# Patient Record
Sex: Male | Born: 1955 | Race: Black or African American | Hispanic: No | Marital: Married | Smoking: Former smoker
Health system: Southern US, Community
[De-identification: ages and names within clinical notes are randomized; demographics above are authoritative.]

## PROBLEM LIST (undated history)

## (undated) DIAGNOSIS — I10 Essential (primary) hypertension: Secondary | ICD-10-CM

## (undated) DIAGNOSIS — E7800 Pure hypercholesterolemia, unspecified: Secondary | ICD-10-CM

## (undated) DIAGNOSIS — K219 Gastro-esophageal reflux disease without esophagitis: Secondary | ICD-10-CM

## (undated) HISTORY — PX: COLONOSCOPY: SHX174

---

## 1999-12-31 ENCOUNTER — Inpatient Hospital Stay (HOSPITAL_COMMUNITY): Admission: AD | Admit: 1999-12-31 | Discharge: 2000-01-01 | Payer: Self-pay | Admitting: Cardiovascular Disease

## 2000-01-01 ENCOUNTER — Encounter: Payer: Self-pay | Admitting: Cardiovascular Disease

## 2000-11-01 ENCOUNTER — Emergency Department (HOSPITAL_COMMUNITY): Admission: EM | Admit: 2000-11-01 | Discharge: 2000-11-01 | Payer: Self-pay | Admitting: Emergency Medicine

## 2000-11-01 ENCOUNTER — Encounter: Payer: Self-pay | Admitting: Emergency Medicine

## 2002-06-08 ENCOUNTER — Emergency Department (HOSPITAL_COMMUNITY): Admission: EM | Admit: 2002-06-08 | Discharge: 2002-06-08 | Payer: Self-pay | Admitting: *Deleted

## 2004-05-10 ENCOUNTER — Emergency Department (HOSPITAL_COMMUNITY): Admission: EM | Admit: 2004-05-10 | Discharge: 2004-05-10 | Payer: Self-pay | Admitting: Emergency Medicine

## 2004-07-06 ENCOUNTER — Inpatient Hospital Stay (HOSPITAL_COMMUNITY): Admission: EM | Admit: 2004-07-06 | Discharge: 2004-07-10 | Payer: Self-pay | Admitting: Emergency Medicine

## 2004-09-19 ENCOUNTER — Ambulatory Visit (HOSPITAL_COMMUNITY): Admission: RE | Admit: 2004-09-19 | Discharge: 2004-09-19 | Payer: Self-pay | Admitting: Family Medicine

## 2005-05-26 ENCOUNTER — Ambulatory Visit (HOSPITAL_COMMUNITY): Admission: RE | Admit: 2005-05-26 | Discharge: 2005-05-26 | Payer: Self-pay | Admitting: Internal Medicine

## 2005-05-26 ENCOUNTER — Ambulatory Visit: Payer: Self-pay | Admitting: Internal Medicine

## 2005-12-28 IMAGING — CT CT CHEST W/O CM
1 of 2 series · 14 of 32 positions shown, 18 images · non-contrast
Comparison: none

HISTORY: Followup lung nodules

[Series 9126: — · axial · 0.63mm/px · z∈[+1572,+1847]mm · 14 of 66 slices shown, 18 images]
[im 6/66  mediastinal]
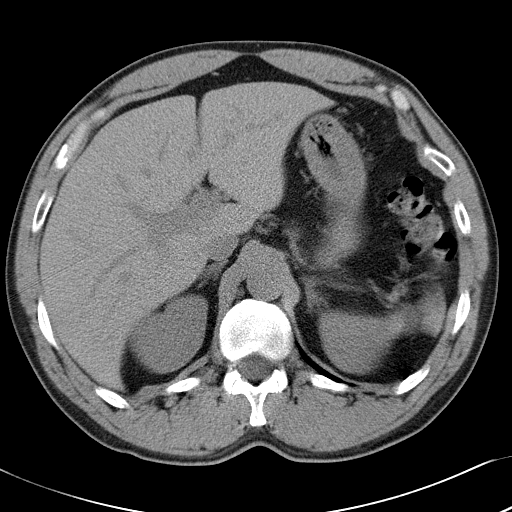
[im 6/66  lung]
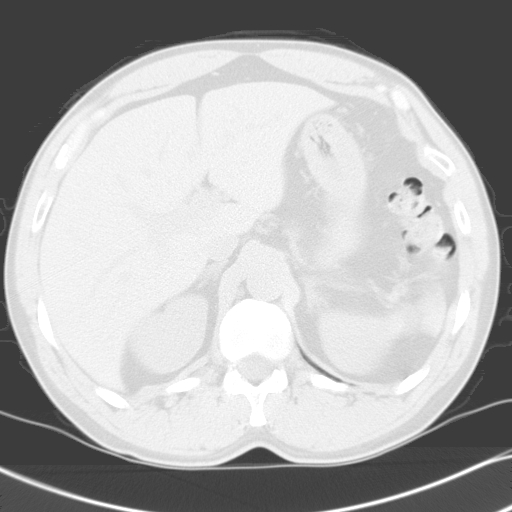
[im 11/66  lung]
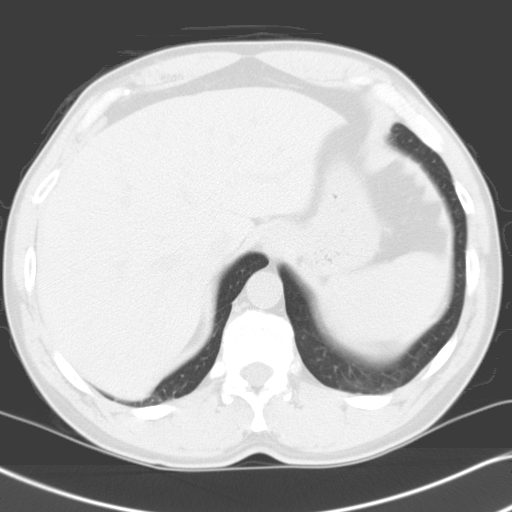
[im 16/66  lung]
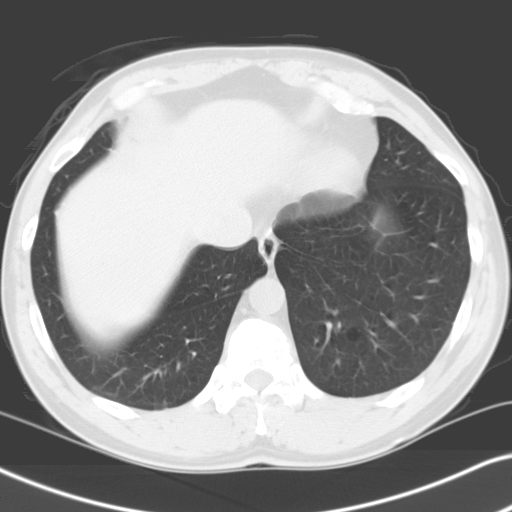
[im 21/66  lung]
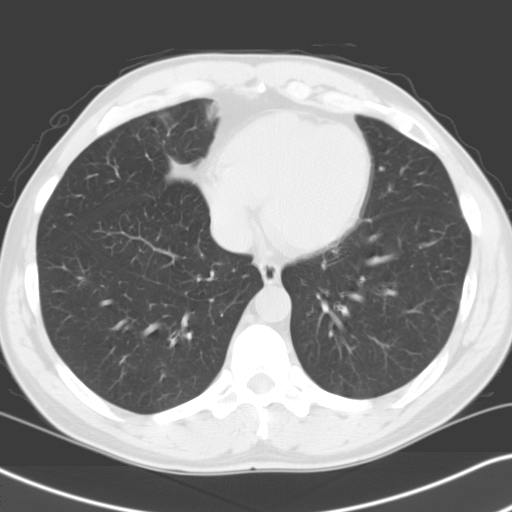
[im 26/66  mediastinal]
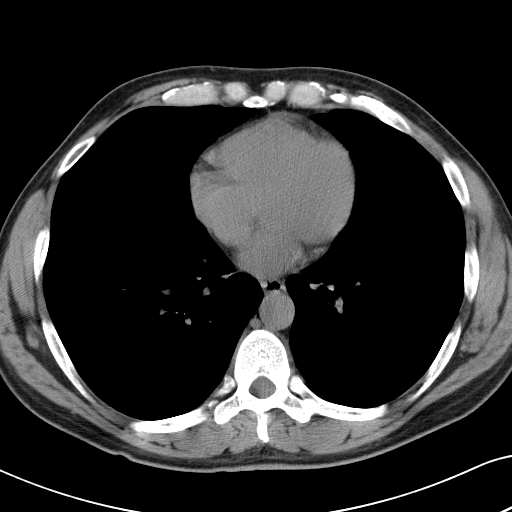
[im 26/66  lung]
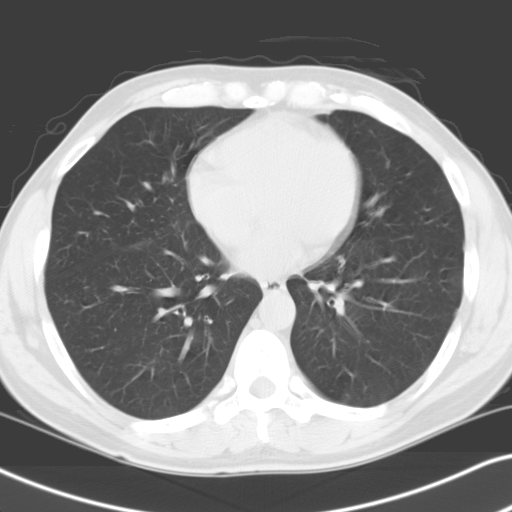
[im 31/66  lung]
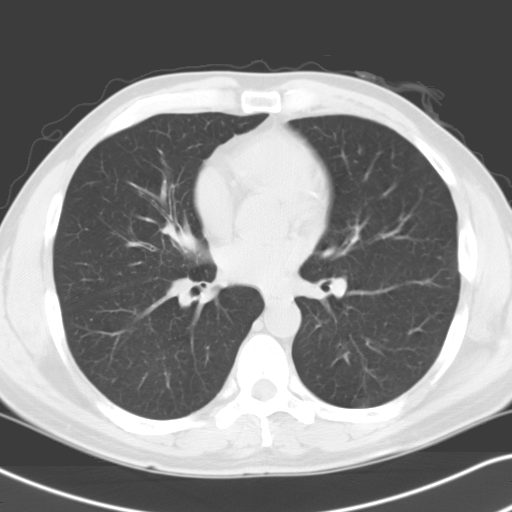
[im 32/66  lung]
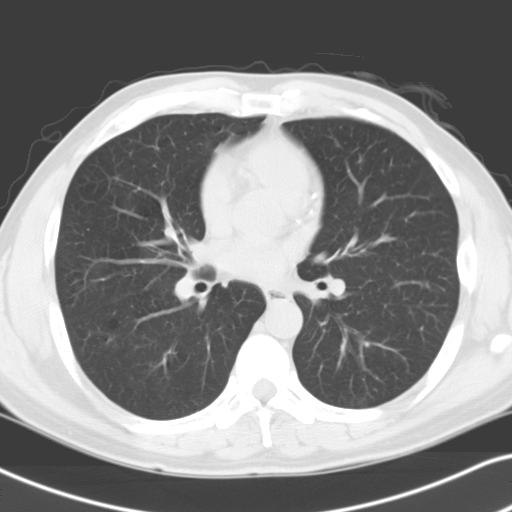
[im 33/66  lung]
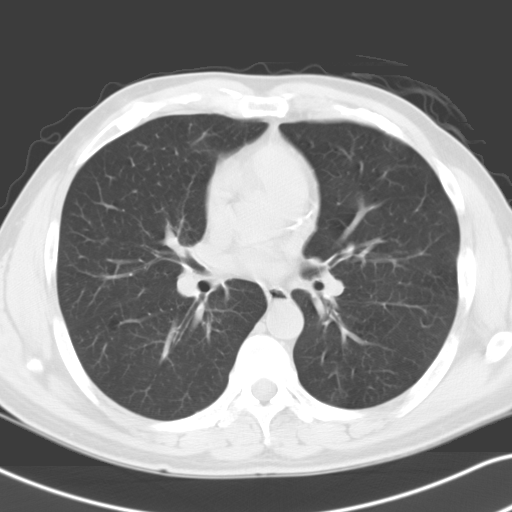
[im 36/66  mediastinal]
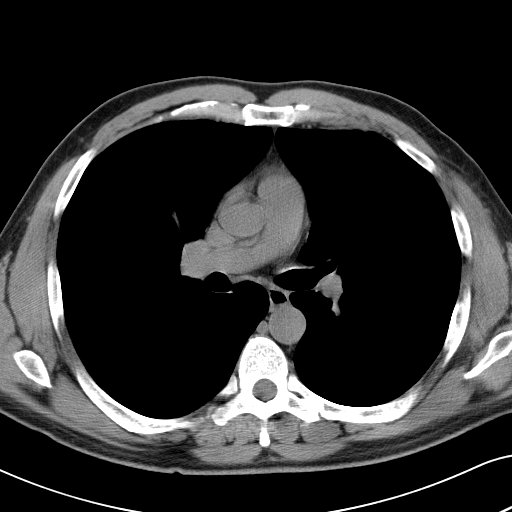
[im 36/66  lung]
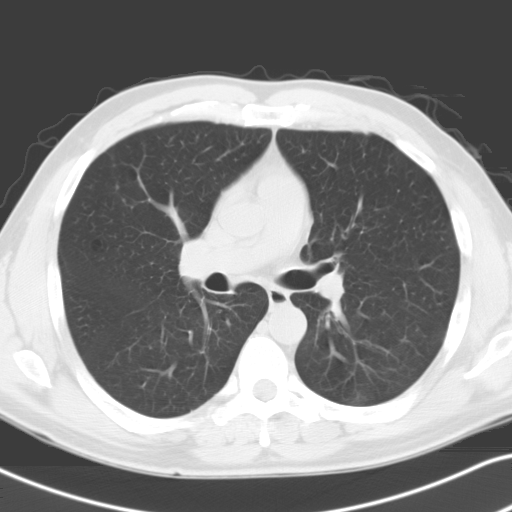
[im 41/66  lung]
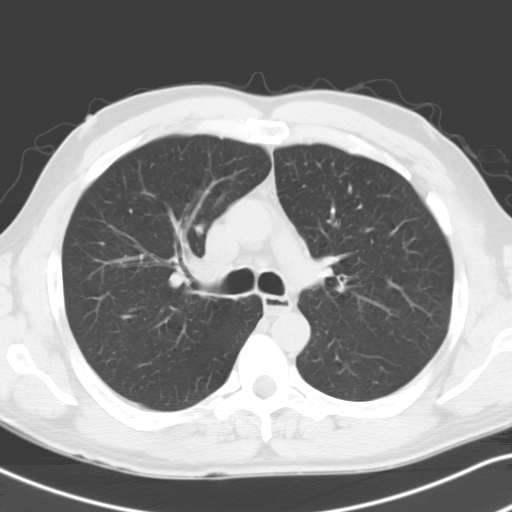
[im 46/66  lung]
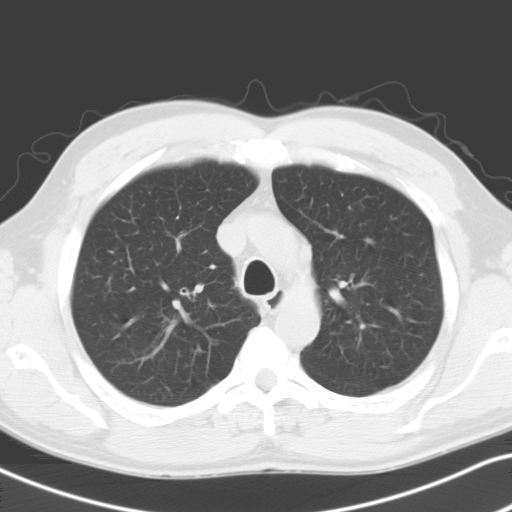
[im 51/66  lung]
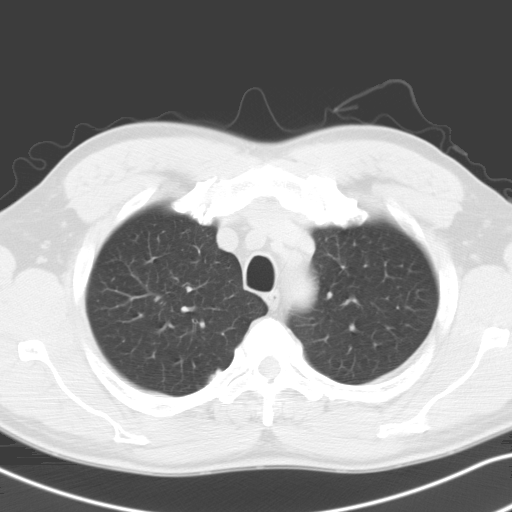
[im 56/66  mediastinal]
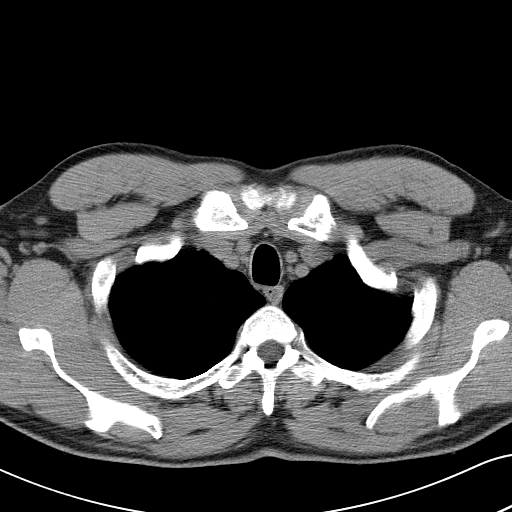
[im 56/66  lung]
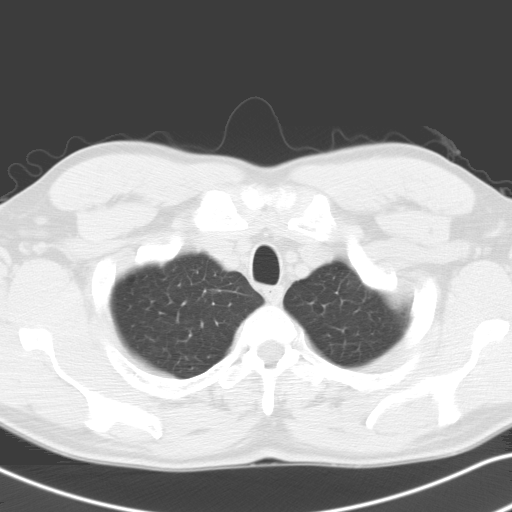
[im 61/66  lung]
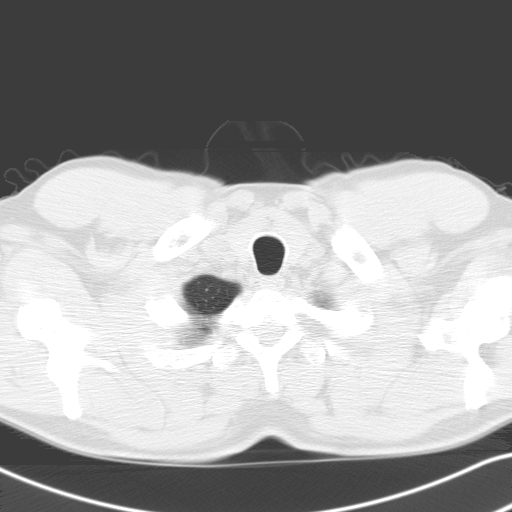

[14 of 32 positions shown; findings below may reference images not displayed]

CT CHEST WITHOUT CONTRAST:

Multidetector noncontrast helical CT imaging chest compared to 07/08/2004.

Tiny subpleural nodular densities in posteromedial aspect right upper lobe,
image 16, unchanged.
Additional tiny subpleural nodule in posterolateral aspect left lower lobe, 4 mm
diameter image 47, unchanged.
No new pulmonary mass or nodule.
No pleural effusion or pneumothorax.
Scattered normal sized mediastinal nodes.
Aorta normal caliber.
Visualized portion of upper abdomen unremarkable.
IMPRESSION: Stable appearance of tiny subpleural nodular foci in right upper lobe and left
lower lobe.
Recommend confirmation of stability for a total of 2 years to confirm benign
etiology.
Recommend next CT chest, without contrast, in 6 months.

## 2006-03-09 ENCOUNTER — Emergency Department (HOSPITAL_COMMUNITY): Admission: EM | Admit: 2006-03-09 | Discharge: 2006-03-09 | Payer: Self-pay | Admitting: Emergency Medicine

## 2006-08-16 ENCOUNTER — Ambulatory Visit (HOSPITAL_COMMUNITY): Admission: RE | Admit: 2006-08-16 | Discharge: 2006-08-16 | Payer: Self-pay | Admitting: Family Medicine

## 2006-08-31 ENCOUNTER — Emergency Department (HOSPITAL_COMMUNITY): Admission: EM | Admit: 2006-08-31 | Discharge: 2006-08-31 | Payer: Self-pay | Admitting: Emergency Medicine

## 2007-09-16 ENCOUNTER — Ambulatory Visit (HOSPITAL_COMMUNITY): Admission: RE | Admit: 2007-09-16 | Discharge: 2007-09-16 | Payer: Self-pay | Admitting: Family Medicine

## 2008-01-04 ENCOUNTER — Ambulatory Visit (HOSPITAL_COMMUNITY): Admission: RE | Admit: 2008-01-04 | Discharge: 2008-01-04 | Payer: Self-pay | Admitting: Family Medicine

## 2008-06-15 ENCOUNTER — Ambulatory Visit: Payer: Self-pay | Admitting: Gastroenterology

## 2008-06-19 ENCOUNTER — Encounter: Payer: Self-pay | Admitting: Gastroenterology

## 2008-06-19 ENCOUNTER — Ambulatory Visit (HOSPITAL_COMMUNITY): Admission: RE | Admit: 2008-06-19 | Discharge: 2008-06-19 | Payer: Self-pay | Admitting: Gastroenterology

## 2008-06-19 ENCOUNTER — Ambulatory Visit: Payer: Self-pay | Admitting: Gastroenterology

## 2008-07-13 ENCOUNTER — Encounter: Payer: Self-pay | Admitting: Gastroenterology

## 2008-07-23 DIAGNOSIS — K3189 Other diseases of stomach and duodenum: Secondary | ICD-10-CM | POA: Insufficient documentation

## 2008-07-23 DIAGNOSIS — R1013 Epigastric pain: Secondary | ICD-10-CM

## 2008-07-23 DIAGNOSIS — R197 Diarrhea, unspecified: Secondary | ICD-10-CM | POA: Insufficient documentation

## 2008-07-23 DIAGNOSIS — R109 Unspecified abdominal pain: Secondary | ICD-10-CM | POA: Insufficient documentation

## 2008-12-24 ENCOUNTER — Ambulatory Visit (HOSPITAL_COMMUNITY): Admission: RE | Admit: 2008-12-24 | Discharge: 2008-12-24 | Payer: Self-pay | Admitting: Family Medicine

## 2010-05-31 ENCOUNTER — Encounter: Payer: Self-pay | Admitting: Family Medicine

## 2010-06-02 ENCOUNTER — Encounter: Payer: Self-pay | Admitting: Family Medicine

## 2010-09-06 ENCOUNTER — Emergency Department (HOSPITAL_COMMUNITY)
Admission: EM | Admit: 2010-09-06 | Discharge: 2010-09-06 | Disposition: A | Discharge status: Home / Self Care | Payer: BC Managed Care – PPO | Attending: Emergency Medicine | Admitting: Emergency Medicine

## 2010-09-06 DIAGNOSIS — J449 Chronic obstructive pulmonary disease, unspecified: Secondary | ICD-10-CM | POA: Insufficient documentation

## 2010-09-06 DIAGNOSIS — J4489 Other specified chronic obstructive pulmonary disease: Secondary | ICD-10-CM | POA: Insufficient documentation

## 2010-09-23 NOTE — Op Note (Signed)
NAME:  Dalton Gregory, Dalton Gregory NO.:  [id number]   MEDICAL RECORD NO.:  [id number]          PATIENT TYPE:  AMB   LOCATION:  DAY                           FACILITY:  [facility]   PHYSICIAN:  Kassie Mends, M.D.      DATE OF BIRTH:  February 06, 1956   DATE OF PROCEDURE:  06/19/2008  DATE OF DISCHARGE:                               OPERATIVE REPORT   REFERRING PHYSICIAN:  Corrie Mckusick, MD   PROCEDURE:  Esophagogastroduodenoscopy with cold forceps biopsy.   INDICATION FOR EXAM:  Mr. Spease is a 55 year old male who presents  with new-onset dyspepsia.  He is using BC powders and drinks alcohol on  a regular basis.   FINDINGS:  1. Normal esophagus without evidence of Barrett, mass, erosion,      ulceration, or stricture.  2. Diffuse erythema in the body and the antrum without erosion or      ulceration.  Biopsies obtained via cold forceps to evaluate for H.      pylori gastritis.  3. Patchy erythema in the duodenal bulb and extending into the second      portion of the duodenum.   DIAGNOSES:  Mild-to-moderate gastritis and duodenitis.   RECOMMENDATIONS:  1. Screening colonoscopy in 2017.  2. He should avoid BC powders and reduce his alcohol use.  3. No aspirin or NSAIDs for 30 days.  4. He may continue Nexium or Zegerid once daily.  5. We will call him with results of his biopsies.  6. He should follow a high-fiber diet.  He was given a handout on high-      fiber diet and gastritis.  7. He already has a follow up appointment to see me in 6 weeks.   MEDICATIONS:  1. Demerol 100 mg IV.  2. Versed 5 mg IV.   PROCEDURE TECHNIQUE:  Physical exam was performed.  Informed consent was  obtained from the patient after explaining the benefits, risks, and  alternatives to the procedure.  The patient was connected to the monitor  and placed in the left lateral position.  Continuous oxygen was provided  by nasal cannula, and IV medicine was administered through an indwelling  cannula.   After administration of sedation, the patient's esophagus was  intubated and the scope was advanced under direct visualization to the  second portion of the duodenum.  The scope was  removed slowly by carefully examining the color, texture, anatomy, and  integrity of the mucosa on the way out. The patient was recovered in  endoscopy and discharged home in satisfactory condition.   PATH:  Moderate gastritis 2o to EtOH and BC powders.      Kassie Mends, M.D.  Electronically Signed     SM/MEDQ  D:  06/19/2008  T:  06/19/2008  Job:  [id number]   cc:   Corrie Mckusick, M.D.  Fax: [phone]

## 2010-09-23 NOTE — Consult Note (Signed)
NAME:  Dalton Gregory, FODOR NO.:  [id number]   MEDICAL RECORD NO.:  [id number]          PATIENT TYPE:  AMB   LOCATION:  DAY                           FACILITY:  [facility]   PHYSICIAN:  Kassie Mends, M.D.      DATE OF BIRTH:  [birth year]   DATE OF CONSULTATION:  DATE OF DISCHARGE:                                 CONSULTATION   REFERRING PHYSICIAN:  Corrie Mckusick, MD   REASON FOR CONSULTATION:  Abdominal discomfort.   HISTORY OF PRESENT ILLNESS:  Dalton Gregory is a 55 year old male who  complains of problems with the stomach for the last 6-8 months.  He  consumes 1 pint of Patron with Sprite per week.  He also takes Aleve for  headaches.  He was taking omeprazole for his symptoms, which made them  improve but caused him to have dizziness and headaches.  He describes  the discomfort as a gassy feeling in his left upper quadrant and right  upper quadrant and down the left side of his abdomen.  He has had  heartburn and indigestion off and on for 3-4 years.  He was never really  on any regular medicine and took Tums as needed.  He does not have any  abdominal pain with food or with his intermittent diarrhea.  The only  thing that made his symptoms better was the omeprazole.  He denies any  problems of swallowing.  He does have intermittent watery stool with  constipation and normal stool.  He denies any abdominal pain.  He  usually has two bowel movements a day without straining.  He denies any  blood in his stool or black stool.  He was using BC powders when he had  headaches.  He is not using Goody powder, ibuprofen, or Motrin.  When he  had a bad cold he went from 176 pounds to 171 pounds.  His appetite is  good.  He did have this problem with this bubbling sensation in the  stomach 10 years ago.   PAST MEDICAL HISTORY:  Asthma and uses albuterol once a month.   PAST SURGICAL HISTORY:  None.   ALLERGIES:  OMEPRAZOLE.   MEDICATIONS:  Albuterol as needed.   FAMILY  HISTORY:  His mother died of breast cancer last year.  He denies  any family history of colon cancer or colon polyps.   SOCIAL HISTORY:  He is engaged.  He works for [employer]  He does not smoke.   REVIEW OF SYSTEMS:  Per the HPI, otherwise all systems are negative.   PHYSICAL EXAMINATION:  VITAL SIGNS:  Weight 171 pounds, height 5 feet 9  inches, BMI 25.3 (slightly overweight), temperature 98.2, blood pressure  120/78, and pulse 72.  GENERAL:  He is in no apparent distress, alert, and oriented x4.  HEENT:  Atraumatic and normocephalic.  Pupils equal, reactive to light.  Mouth, no oral lesions.  Posterior pharynx without erythema or exudate.  NECK:  Full range of motion.  No lymphadenopathy.  LUNGS:  Clear to auscultation bilaterally.  CARDIOVASCULAR:  Regular  rhythm.  No murmur.  Normal S1 and S2.  ABDOMEN:  Bowel sounds are present, soft, nontender, and nondistended.  No rebound or guarding.  No hepatosplenomegaly or abdominal bruits.  EXTREMITIES:  No cyanosis or edema.  NEUROLOGIC:  He has no focal neurologic deficits.   LABORATORY DATA:  In May 2009, white count 7.1, hemoglobin 13.4,  platelets 331, BUN 15, creatinine 0.95, total bilirubin 0.5, alk phos  50, AST 26, ALT 32, albumin 4.4, and PSA normal.   RADIOGRAPHIC STUDIES:  Abdominal ultrasound in August 2009 revealed  normal gallbladder without stones, common bile duct 2.9 mm, liver and  pancreas normal in appearance.   ENDOSCOPY:  Colonoscopy in January 2007 revealed right-sided  diverticula.   ASSESSMENT:  Dalton Gregory is a 55 year old male who has got new onset  dyspepsia.  The differential diagnosis include uncontrolled  gastroesophageal reflux disease, Helicobacter pylori gastritis, NSAID  gastritis, or insidious peptic ulcer disease, and low likelihood of  esophageal or gastric malignancy. Thank you for allowing me to see Mr.  Kroft in consultation.  My recommendations follow.    RECOMMENDATIONS:  1. Management of his bowel movement.  He is given discharge      instructions in writing.  He is to drink 6-8 cups of water daily.      He is to try high-fiber diet.  He is given a handout on high-fiber      diet.  He is asked to use Fibersure once a day.  He is also asked      to reduce his uses alcohol consumption.  Alcohol can cause      diarrhea.  2. He will be scheduled for an EGD on next Tuesday.  3. He has a return patient visit in 6 weeks.      Kassie Mends, M.D.  Electronically Signed     SM/MEDQ  D:  06/15/2008  T:  06/16/2008  Job:  [id number]   cc:   Corrie Mckusick, M.D.  Fax: [phone]

## 2010-09-26 NOTE — Discharge Summary (Signed)
[place]. [facility]  Patient:    Dalton Gregory                      MRN: [id number] Adm. Date:  [id number] Disc. Date: [id number] Attending:  Virgina Evener Dictator:   [facility].                           Discharge Summary  ADMISSION DIAGNOSES: 1. Chest pain of questionable etiology. 2. History of tobacco use.  DISCHARGE DIAGNOSES: 1. Chest pain of questionable etiology. 2. History of tobacco use. 3. Status post cardiac catheterization, January 01, 2000, with normal coronary    arteries and normal left ventricular function.  HISTORY OF PRESENT ILLNESS:  The patient is a 55 year old African-American male who presented to [facility] on December 30, 1999, with complaints of chest pain.  He had no prior cardiac history.  He does have cardiac risk factors positive for smoking one-pack-a-day for 10 years.  He stated that he had the onset of left chest pain at approximately 8 a.m. while driving.  He said it was a 7/10 chest pain.  It was sharp at the beginning and then become a dull pain with radiation to the left neck and left arm numbness.  It last for one hour.  It was relieved with sublingual nitroglycerin in the [facility] ER.  At the time of interview here at [facility], he had residual chest pressure of approximately 3/10.  There were no associated symptoms at the time.  No palpitations, particularly syncope and no syncope.  PHYSICAL EXAMINATION:  On exam at that time his blood pressure was 120/77, heart rate 63.  EKG revealed normal sinus rhythm with no ST or T-wave abnormality.  Cardiac enzymes were negative.  At that time, he was seen by Dr. Jenne Campus.  He was planned for admission to telemetry.  We would check serial enzymes to rule out MI.  He was placed on IV heparin and IV nitroglycerin, aspirin and Plavix, and planned for cardiac catheterization the following day.  HOSPITAL COURSE:  On January 01, 2000, the patient  underwent cardiac catheterization by Dr. Nicki Guadalajara.  He was found to have normal coronary arteries and normal LV function.  He was planned for discharge home after bed rest complete later that day.  At 4:30 p.m. on January 01, 2000, the patient remained stable.  He was hemodynamically stable, and his groin was stable without bleed or hematoma. He was then planned for discharge home.  HOSPITAL CONSULTS:  None.  HOSPITAL PROCEDURES:  Cardiac catheterization on January 01, 2000, by Dr. Nicki Guadalajara revealing normal coronary arteries and normal LV function.  EKG on admission revealed normal sinus rhythm with no ST or T-wave changes.  Laboratories revealed TSH normal at 2.701 with a profile with cholesterol of 162, triglycerides 72, HDL 79, LDL 69.  Cardiac enzymes were negative with CK of 196 and 160, CKMB 2.1 and 2.1, troponin I of less than 0.03 x 2.  Metabolic profile showed sodium of 139, potassium 3.6, BUN 15, creatinine 1.9.  CBC revealed WBC of 9.9, hemoglobin 13.1, hematocrit 38.3, platelets 274.  DISCHARGE MEDICATIONS:  None.  ACTIVITY:  No strenuous activity, lifting greater than five pounds, driving, or sexual activity for three days.  DIET:  Low salt and low fat diet.  WOUND CARE:  To gently wash the wound with warm water and soap.  FOLLOWUP:  Call our office at [phone] if any bleeding, or increased size or pain at the groin.  Followup with Dr. Tresa Endo in [place] on September 11 at 3:10 p.m. DD:  01/08/00 TD:  01/09/00 Job: [id number]

## 2010-09-26 NOTE — Discharge Summary (Signed)
NAME:  Dalton Gregory, Dalton Gregory NO.:  [id number]   MEDICAL RECORD NO.:  [id number]          PATIENT TYPE:  INP   LOCATION:  A305                          FACILITY:  [facility]   PHYSICIAN:  Corrie Mckusick, M.D.  DATE OF BIRTH:  04-02-1956   DATE OF ADMISSION:  07/06/2004  DATE OF DISCHARGE:  03/02/2006LH                                 DISCHARGE SUMMARY   DISCHARGE DIAGNOSES:  1.  Chronic obstructive pulmonary disease exacerbation.  2.  Bronchitis.   HISTORY OF PRESENT ILLNESS:   PAST MEDICAL HISTORY:  Please see admission H&P.   HOSPITAL COURSE:  A 55 year old gentleman with a longstanding smoking  history who presents for two-three days with aggressive problems with  coughing and productive sputum.  He has COPD with heavy smoking use and was  admitted with the diagnosis of bronchitis.  He was admitted for aggressive  nebulizer treatments with Xopenex and __________ nebulizers.  He also had  double coverage with Rocephin and azithromycin.  Prednisone was begun as  well.   He improved slowly over the proceeding days.  I went ahead and did a CAT  scan of the chest to get a better look at the lungs.  The scan showed tiny  areas of subpleural nodularity of the right upper lobe and left upper lobe.  I wanted these to be followed up in three months.  Due to this, I decided to  have Dr. Juanetta Gosling take a look from a pulmonary standpoint.  Please see his  consult for details.  He had nothing more to add and I agree with the  followup CT.   The began to improve on a daily basis and was ready for discharge on July 10, 2004.  T-max of 98.8, blood pressure was in the 120s-130s/70s, heart rate  in the 80s-90s, respiratory rate in the 20s, O2 saturation 96% on room air.  When I saw Dammon he was pleasant, talkative, and feeling much better.  HEENT,  the nasopharynx was clear with moist mucous membranes.  Neck was supple with  no lymphadenopathy.  Chest was with much better breath sounds.   No wheezes,  no rhonchi, no rales.  Cardiovascular was a regular rate and rhythm.  Normal  S1 and S2.  No murmurs.  Abdomen soft and nontender.  Extremities no edema.   DISCHARGE MEDICATIONS:  Include:  1.  Augmentin 875 b.i.d. for an additional seven days.  2.  Combivent 2 puffs q.i.d.  3.  Anaplex syrup 1 tsp. q.4-6h. p.r.n.   DISCHARGE INSTRUCTIONS:  I strongly encouraged him to quit smoking.   FOLLOW UP:  He is going to follow up in 1-1/2 weeks with myself in the  office and we will again repeat the CT scan in approximately three months.   DISCHARGE CONDITION:  Improved and stable.      JCG/MEDQ  D:  07/10/2004  T:  07/10/2004  Job:  [id number]

## 2010-09-26 NOTE — Op Note (Signed)
NAME:  Dalton Gregory, Dalton Gregory NO.:  [id number]   MEDICAL RECORD NO.:  [id number]          PATIENT TYPE:  AMB   LOCATION:  DAY                           FACILITY:  [facility]   PHYSICIAN:  Lionel December, M.D.    DATE OF BIRTH:  [birth year]   DATE OF PROCEDURE:  05/26/2005  DATE OF DISCHARGE:                                 OPERATIVE REPORT   PROCEDURE:  Colonoscopy.   INDICATION:  [name] is a 56 year old Afro-American male who is here for  screening colonoscopy. Family history is negative for colorectal carcinoma.  He states he has occasional diarrhea when he gets irritation to his  hemorrhoids but does not have any hematochezia. Procedure and risks were  reviewed with the patient, and informed consent was obtained.   MEDICINES CONSCIOUS SEDATION:  Demerol 50 mg IV, Versed 5 mg IV.   FINDINGS:  Procedure performed in endoscopy suite. The patient's vital signs  and O2 saturation were monitored during the procedure and remained stable.  The patient was placed in left lateral position and rectal examination  performed. No abnormality noted on external or digital exam. Olympus  videoscope was placed in rectum and advanced under vision into sigmoid colon  and beyond. Preparation was excellent. Scope was passed into cecum which was  identified by appendiceal orifice and ileocecal valve. Pictures taken for  the record. As the scope was withdrawn, colonic mucosa was carefully  examined. There a few diverticula involving the ascending colon but none  distally. Rectal mucosa similarly was normal. Scope was retroflexed to  examine anorectal junction, and he had two small anal papilla. Endoscope was  straightened and withdrawn. The patient tolerated the procedure well.   FINAL DIAGNOSIS:  1.  Few small diverticula involving the ascending colon (right colon).  2.  Two anal papilla.   RECOMMENDATIONS:  1.  High-fiber diet plus fiber supplement 3 to 4 g per day.  2.  He should continue  yearly Hemoccults and consider next screening in 10      years from now.      Lionel December, M.D.  Electronically Signed    NR/MEDQ  D:  05/26/2005  T:  05/26/2005  Job:  [id number]   cc:   Corrie Mckusick, M.D.  Fax: [phone]

## 2010-09-26 NOTE — Consult Note (Signed)
NAME:  YVAN, DORITY NO.:  [id number]   MEDICAL RECORD NO.:  [id number]          PATIENT TYPE:  INP   LOCATION:  A305                          FACILITY:  [facility]   PHYSICIAN:  Edward L. Juanetta Gosling, M.D.DATE OF BIRTH:  1955-11-07   DATE OF CONSULTATION:  DATE OF DISCHARGE:                                   CONSULTATION   REASON FOR CONSULTATION:  Abnormal CT scan.  COPD.   HISTORY:  Mr. Pankratz is a 55 year old African American male who has had a  long smoking history and diagnosis of COPD.  He came to the emergency room  because of shortness of breath and productive cough.  He was wheezing, and  in the emergency room he had a pO2 of 62, pCO2 36, pH 7.37 on room air.  He  had been given treatments in the emergency room but did not clear very much  and was admitted.   PAST MEDICAL HISTORY:  Other than his COPD, essentially negative.  He has  had no surgery.   MEDICATIONS:  He is on no medications, although he says he wants an inhaler.   SOCIAL HISTORY:  He has about an 80 to 90-pack-year smoking history.  He  drinks alcohol on weekends.   REVIEW OF SYSTEMS:  Otherwise is essentially negative.   PHYSICAL EXAMINATION:  VITAL SIGNS:  Temperature 98.4 degrees, pulse 97,  respirations 20.  His O2 saturation is in the 90s.  GENERAL:  He is alert, oriented, and does not appear to be in any acute  distress now.  HEENT:  His mucous membranes are moist.  His nose and throat are clear.  CHEST:  Wheezes bilaterally.  HEART:  Regular, without gallop.  ABDOMEN:  Soft, without masses.  EXTREMITIES:  No edema.  NEUROLOGIC:  CNs grossly intact.   LABORATORY DATA:  His chest x-ray showed COPD but no pneumonia.  His  potassium was 3.2 on admission.  His CBC showed a white count of 14,000.  He  did have some metamyelocytes.   ASSESSMENT:  He has chronic obstructive pulmonary disease with exacerbation,  possibly a pneumonia.  I have reviewed his CT scan, and I agree with the  interpretation by the radiologist.  The findings on the CT are too  nondescript to be certain what we are dealing with.  I do think he will need  to have a repeat CT.  I am somewhat concerned about the fact that he had  metamyelocytes seen on his admission CBC, so I think we need to go ahead  and repeat that and just make sure he is okay.  His potassium should be  rechecked to make sure that is okay because of the use of the beta agonist  and the effect on causing hypokalemia.  Otherwise, I certainly agree with  treatments and would not pursue the CT further except to go ahead with a  repeat CT in approximately 3 months.      ELH/MEDQ  D:  07/09/2004  T:  07/09/2004  Job:  [id number]

## 2010-09-26 NOTE — Cardiovascular Report (Signed)
[place]. [facility]  Patient:    Dalton Gregory, Dalton Gregory                      MRN: [id number] Proc. Date: 01/01/00 Adm. 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  He presented to [facility] Emergency Room yesterday with significant 1 hour of substernal chest pressure without definitive ECG changes.  Cardiac risk factors are notable for tobacco use, as well as, elevated lipids.  He was treated with IV heparin, nitroglycerin with relief. He did have some residual chest pain upon presenting to [facility].  He was referred for definitive diagnostic cardiac catheterization.  HEMODYNAMIC DATA:  Central aortic pressure 117/70, left ventricular pressure 117/21.  ANGIOGRAPHIC DATA:  Left main coronary artery was a long, normal vessel that bifurcated into a LAD and left circumflex system.  The LAD was angiographically normal and gave rise to several diagonal vessels and a small septal perforating arteries and extended and wrapped around the LV apex.  The circumflex vessel was angiographically normal and gave rise to a small proximal marginal vessel and a large marginal vessel, which extended to the inferolateral apex.  The right coronary artery was a large caliber, normal vessel that gave rise to a small PDA and posterolateral system.  Biplane angiography revealed normal global LV function without focal segmental wall motion abnormalities.  IMPRESSION: 1. Normal LV function. 2. Normal coronary arteries. DD:  01/01/00 TD:  01/01/00 Job: [id number]

## 2010-09-26 NOTE — H&P (Signed)
NAME:  Dalton Gregory, Dalton Gregory NO.:  [id number]   MEDICAL RECORD NO.:  [id number]          PATIENT TYPE:  INP   LOCATION:  A305                          FACILITY:  [facility]   PHYSICIAN:  Corrie Mckusick, M.D.  DATE OF BIRTH:  Feb 20, 1956   DATE OF ADMISSION:  07/06/2004  DATE OF DISCHARGE:  LH                                HISTORY & PHYSICAL   ADMISSION DIAGNOSES:  1.  Chronic obstructive pulmonary disease exacerbation.  2.  Bronchitis.   HISTORY OF PRESENT ILLNESS:  This is a 55 year old gentleman with long-  standing history of smoking who presents with two to three days of  progressive cough and productive sputum.  No frank shortness of breath, but  quite a bit of wheeze.  He was seen in the emergency department with an ABG  of 7.37, CO2 36.8, PO2 of 62.8, bicarbonate of 20.8, on room air.  This was  not an acute shortness of breath, it had been progressive.  He is a heavy  smoker, and on examination in the emergency department was given nebulizer  treatments with some slight improvement.   He reports no cardiovascular symptomatology such as chest pain or associated  PND or orthopnea.  He has really no other comorbidities other than heavy  cigarette use.   PAST MEDICAL HISTORY:  None.   PAST SURGICAL HISTORY:  None.   ADMISSION MEDICATIONS:  None.   SOCIAL HISTORY:  Smoked a pack a day for many years.  Does drink about a  pint of alcohol a week, but not on a daily basis per patient.  Positive for  hypertension and diabetes, but really no other significant past medical  history.  He is in [employer].   PHYSICAL EXAMINATION:  VITAL SIGNS:  Temperature 98.4, pulse 97,  respirations 20, blood pressure 113/70, O2 saturation 96% on 2 L.  GENERAL:  When I saw him, he was a pleasant, talkative gentleman, somewhat  joking, but quite a bit of cough and congestion.  HEENT:  Nasopharynx clear with moist mucous membranes.  NECK:  There is some erythema on the posterior  wall of the neck.  Bilateral  shotty lymphadenopathy.  CHEST:  Diffuse end expiratory wheezes with upper rhonchi heard throughout.  There is no real areas of consolidation.  CARDIOVASCULAR:  Regular rate and rhythm, normal S1 and S2, no S3, S4,  murmurs, gallops, or rubs.  ABDOMEN:  Soft, nontender, nondistended.  EXTREMITIES:  No edema.   LABORATORY DATA:  Chest x-ray on admission showed COPD with no evidence of  active chest disease.   Labs on admission:  Please see flow sheet for details.   ASSESSMENT AND PLAN:  A 55 year old gentleman with chronic obstructive  pulmonary disease and heavy smoking use who presents with bronchitis.   PLAN:  1.  Admit for aggressive nebulizer treatments with Xopenex and Atrovent      nebulizers q.i.d.  2.  Albuterol nebulizers q.2h. p.r.n.  3.  Double coverage with Rocephin 1 g intravenously q.24h., as well as      azithromycin 500 mg intravenously q.24h.  4.  Strongly encouraged him to quit smoking.  5.  We will cover with prednisone 50 mg daily.  6.  If the chest congestion continues, we will go ahead and order a chest CT      scan.  7.  We will keep O2 saturations greater than 90% on 2 L via nasal cannula.      JCG/MEDQ  D:  07/08/2004  T:  07/08/2004  Job:  [id number]

## 2011-07-17 ENCOUNTER — Encounter (HOSPITAL_COMMUNITY): Payer: Self-pay | Admitting: *Deleted

## 2011-07-17 ENCOUNTER — Emergency Department (HOSPITAL_COMMUNITY)
Admission: EM | Admit: 2011-07-17 | Discharge: 2011-07-17 | Disposition: A | Discharge status: Home / Self Care | Payer: BC Managed Care – PPO | Attending: Emergency Medicine | Admitting: Emergency Medicine

## 2011-07-17 DIAGNOSIS — R040 Epistaxis: Secondary | ICD-10-CM

## 2011-07-17 DIAGNOSIS — K219 Gastro-esophageal reflux disease without esophagitis: Secondary | ICD-10-CM | POA: Insufficient documentation

## 2011-07-17 DIAGNOSIS — J45909 Unspecified asthma, uncomplicated: Secondary | ICD-10-CM | POA: Insufficient documentation

## 2011-07-17 HISTORY — DX: Gastro-esophageal reflux disease without esophagitis: K21.9

## 2011-07-17 MED ORDER — OXYMETAZOLINE HCL 0.05 % NA SOLN
NASAL | Status: AC
  Administered 2011-07-17: 06:00:00
  Filled 2011-07-17: qty 15

## 2011-07-17 MED ORDER — SILVER NITRATE-POT NITRATE 75-25 % EX MISC
CUTANEOUS | Status: AC
  Administered 2011-07-17: 06:00:00
  Filled 2011-07-17: qty 1

## 2011-07-17 MED ORDER — LIDOCAINE-EPINEPHRINE (PF) 1 %-1:200000 IJ SOLN
INTRAMUSCULAR | Status: AC
  Administered 2011-07-17: 06:00:00
  Filled 2011-07-17: qty 10

## 2011-07-17 NOTE — ED Provider Notes (Addendum)
History     CSN: [id number]  Arrival date & time 07/17/11  0457   First MD Initiated Contact with Patient 07/17/11 [phone]      Chief Complaint  Patient presents with  . Epistaxis    (Consider location/radiation/quality/duration/timing/severity/associated sxs/prior treatment) HPI Comments: 56 year old male with history of asthma and acid reflux presents with a complaint of nosebleed. This occurred acutely overnight, has been intermittent, improved with holding pressure on the anterior nose but has been persistent and mild. He denies being on any anticoagulant therapy, no history of coagulopathy, no trauma, no sneezing coughing or taking it. Symptoms are persistent, mild, better with applying pressure to the anterior nose.  He does admit to having a cough or cold-like symptoms in the last 2 weeks.  Patient is a 56 y.o. male presenting with nosebleeds. The history is provided by the patient.  Epistaxis     Past Medical History  Diagnosis Date  . Asthma   . Acid reflux     History reviewed. No pertinent past surgical history.  No family history on file.  History  Substance Use Topics  . Smoking status: Never Smoker   . Smokeless tobacco: Not on file  . Alcohol Use: Yes      Review of Systems  HENT: Positive for nosebleeds.   Gastrointestinal: Negative for nausea and vomiting.  Hematological: Does not bruise/bleed easily.    Allergies  Nexium  Home Medications   Current Outpatient Rx  Name Route Sig Dispense Refill  . ALBUTEROL IN Inhalation Inhale into the lungs.      BP 157/91  Pulse 68  Temp(Src) 98 F (36.7 C) (Oral)  Resp 18  SpO2 99%  Physical Exam  Nursing note and vitals reviewed. Constitutional: He appears well-developed and well-nourished. No distress.  HENT:  Head: Normocephalic and atraumatic.       Right anterior nostril with focus of bleeding. No blood in the oropharynx  Eyes: Conjunctivae are normal. Right eye exhibits no discharge. Left  eye exhibits no discharge. No scleral icterus.  Neck: Normal range of motion. Neck supple.  Cardiovascular: Normal rate and regular rhythm.   No murmur heard. Pulmonary/Chest: Effort normal and breath sounds normal.  Abdominal: Soft. There is no tenderness.  Musculoskeletal: He exhibits no edema and no tenderness.  Lymphadenopathy:    He has no cervical adenopathy.  Skin: Skin is warm and dry. He is not diaphoretic.    ED Course  EPISTAXIS MANAGEMENT Date/Time: 07/17/2011 5:30 AM Performed by: Eber Hong D Authorized by: Eber Hong D Consent: Verbal consent obtained. Written consent not obtained. Risks and benefits: risks, benefits and alternatives were discussed Consent given by: patient Patient understanding: patient states understanding of the procedure being performed Patient identity confirmed: verbally with patient Time out: Immediately prior to procedure a "time out" was called to verify the correct patient, procedure, equipment, support staff and site/side marked as required. Local anesthetic: topical anesthetic Patient sedated: no Treatment site: right anterior Repair method: anterior pack Post-procedure assessment: bleeding stopped Treatment complexity: simple Patient tolerance: Patient tolerated the procedure well with no immediate complications. Comments: Packing was removed from the nose prior to discharge after cessation of bleeding   (including critical care time)  Labs Reviewed - No data to display No results found.   1. Epistaxis       MDM  Acute onset right anterior nosebleed, cotton ball packing with lidocaine with epinephrine placed initially for packing.  I personally passed the nose with cotton balls, soaked  in lidocaine with epinephrine, after approximately 20 minutes pickup balls were removed and bleeding had completely subsided. The patient had no discomfort with the procedure, was able to use Afrin after the procedure and will be discharged  home in an improved condition to followup with ear nose and throat as needed.        Vida Roller, MD 07/17/11 1308  Vida Roller, MD 07/17/11 [phone]

## 2011-07-17 NOTE — Discharge Instructions (Signed)

## 2011-07-17 NOTE — ED Notes (Signed)
Pt reports he woke up this am to go to the bathroom and his nose began to bleed, he reports he was able to stop the bleeding 2 different times and now unable get bleeding under control, pt has tissue to nose, bleeding is from rt nostril, bleeding controlled at this time

## 2011-12-01 NOTE — H&P (Signed)
  NTS SOAP Note  Vital Signs:  Vitals as of: 12/01/2011: Systolic 130: Diastolic 87: Heart Rate 70: Temp 98.65F: Height 16ft 9in: Weight 174Lbs 0 Ounces: BMI 26  BMI : 25.7 kg/m2  Subjective: This 56 Years 43 Months old Male presents for screening TCS.  Last had a TCS over ten years ago.  Denies any GI complaints.  No family h/o colon cancer.   Review of Symptoms:  Constitutional:unremarkable   Head:unremarkable    Eyes:unremarkable   Nose/Mouth/Throat:unremarkable Cardiovascular:  unremarkable   Respiratory:unremarkable   Gastrointestinal:  unremarkable   Genitourinary:unremarkable     Musculoskeletal:unremarkable   Skin:unremarkable Hematolgic/Lymphatic:unremarkable     Allergic/Immunologic:unremarkable     Past Medical History:    Reviewed   Past Medical History  Surgical History: none Medical Problems: copd, HTN Allergies: nkda Medications: proventil, norvasc   Social History:Reviewed  Social History  Preferred Language: English ([place]) Ethnicity: Not Hispanic / Latino Age: 56 Years 58 Months Marital Status:  S Alcohol:  Yes Recreational drug(s):  No   Smoking Status: Former smoker reviewed on 12/01/2011 Started Date: 05/11/1973 Stopped Date: 05/11/1998   Family History:  Reviewed   Family History  Is there a family history of:No family h/o colon cancer    Objective Information: General:  Well appearing, well nourished in no distress. Neck:  Supple without lymphadenopathy.  Heart:  RRR, no murmur or gallop.  Normal S1, S2.  No S3, S4.  Lungs:    CTA bilaterally, no wheezes, rhonchi, rales.  Breathing unlabored. Abdomen:Soft, NT/ND, no HSM, no masses.   deferred to procedure  Assessment:Need for screening TCS  Diagnosis &amp; Procedure: DiagnosisCode: V76.51, ProcedureCode: 19147,    Plan:Scheduled for TCS on 12/08/11.   Patient Education:Alternative treatments to  surgery were discussed with patient (and family).  Risks and benefits  of procedure were fully explained to the patient (and family) who gave informed consent. Patient/family questions were addressed.  Follow-up:Pending Surgery

## 2011-12-03 ENCOUNTER — Encounter (HOSPITAL_COMMUNITY): Payer: Self-pay | Admitting: Pharmacy Technician

## 2011-12-08 ENCOUNTER — Ambulatory Visit (HOSPITAL_COMMUNITY)
Admission: RE | Admit: 2011-12-08 | Discharge: 2011-12-08 | Disposition: A | Discharge status: Home / Self Care | Payer: BC Managed Care – PPO | Source: Ambulatory Visit | Attending: General Surgery | Admitting: General Surgery

## 2011-12-08 ENCOUNTER — Encounter (HOSPITAL_COMMUNITY)
Admission: RE | Disposition: A | Discharge status: Home / Self Care | Payer: Self-pay | Source: Ambulatory Visit | Attending: General Surgery

## 2011-12-08 ENCOUNTER — Ambulatory Visit: Admit: 2011-12-08 | Payer: Self-pay | Admitting: General Surgery

## 2011-12-08 ENCOUNTER — Encounter (HOSPITAL_COMMUNITY): Payer: Self-pay | Admitting: *Deleted

## 2011-12-08 DIAGNOSIS — J4489 Other specified chronic obstructive pulmonary disease: Secondary | ICD-10-CM | POA: Insufficient documentation

## 2011-12-08 DIAGNOSIS — I10 Essential (primary) hypertension: Secondary | ICD-10-CM | POA: Insufficient documentation

## 2011-12-08 DIAGNOSIS — Z79899 Other long term (current) drug therapy: Secondary | ICD-10-CM | POA: Insufficient documentation

## 2011-12-08 DIAGNOSIS — J449 Chronic obstructive pulmonary disease, unspecified: Secondary | ICD-10-CM | POA: Insufficient documentation

## 2011-12-08 DIAGNOSIS — Z1211 Encounter for screening for malignant neoplasm of colon: Secondary | ICD-10-CM | POA: Insufficient documentation

## 2011-12-08 HISTORY — DX: Essential (primary) hypertension: I10

## 2011-12-08 HISTORY — PX: COLONOSCOPY: SHX5424

## 2011-12-08 SURGERY — COLONOSCOPY
Anesthesia: Monitor Anesthesia Care

## 2011-12-08 SURGERY — COLONOSCOPY
Anesthesia: Moderate Sedation

## 2011-12-08 MED ORDER — MEPERIDINE HCL 50 MG/ML IJ SOLN
INTRAMUSCULAR | Status: AC
  Filled 2011-12-08: qty 1

## 2011-12-08 MED ORDER — SODIUM CHLORIDE 0.45 % IV SOLN
Freq: Once | INTRAVENOUS | Status: AC
  Administered 2011-12-08: 09:00:00 via INTRAVENOUS

## 2011-12-08 MED ORDER — MEPERIDINE HCL 25 MG/ML IJ SOLN
INTRAMUSCULAR | Status: DC | PRN
  Administered 2011-12-08: 50 mg via INTRAVENOUS

## 2011-12-08 MED ORDER — MIDAZOLAM HCL 5 MG/5ML IJ SOLN
INTRAMUSCULAR | Status: DC | PRN
  Administered 2011-12-08: 1 mg via INTRAVENOUS
  Administered 2011-12-08: 3 mg via INTRAVENOUS

## 2011-12-08 MED ORDER — STERILE WATER FOR IRRIGATION IR SOLN
Status: DC | PRN
  Administered 2011-12-08: 09:00:00

## 2011-12-08 MED ORDER — MIDAZOLAM HCL 5 MG/5ML IJ SOLN
INTRAMUSCULAR | Status: AC
  Filled 2011-12-08: qty 5

## 2011-12-08 NOTE — Interval H&P Note (Signed)
History and Physical Interval Note:  12/08/2011 9:07 AM  Dalton Gregory  has presented today for surgery, with the diagnosis of screening  The various methods of treatment have been discussed with the patient and family. After consideration of risks, benefits and other options for treatment, the patient has consented to  Procedure(s) (LRB): COLONOSCOPY (N/A) as a surgical intervention .  The patient's history has been reviewed, patient examined, no change in status, stable for surgery.  I have reviewed the patient's chart and labs.  Questions were answered to the patient's satisfaction.     Franky Macho A

## 2011-12-08 NOTE — Op Note (Signed)
[facility] [address] [place], [place]  [address]  COLONOSCOPY PROCEDURE REPORT  PATIENT:  Dalton Gregory, Dalton Gregory  MR#:  [id number] BIRTHDATE:  Apr 01, 1956, 56 yrs. old  GENDER:  male ENDOSCOPIST:  Franky Macho, MD REF. BY:  Assunta Found, M.D. PROCEDURE DATE:  12/08/2011 PROCEDURE:  Average-risk screening colonoscopy G0121 ASA CLASS:  Class I INDICATIONS:  Screening MEDICATIONS:   Versed 4 mg IV, demerol 50 mg IV  DESCRIPTION OF PROCEDURE:   After the risks benefits and alternatives of the procedure were thoroughly explained, informed consent was obtained.  Digital rectal exam was performed and revealed no abnormalities.   The [id number] ([id number]) endoscope was introduced through the anus and advanced to the cecum, which was identified by both the appendix and ileocecal valve, without limitations.  The quality of the prep was adequate..  The instrument was then slowly withdrawn as the colon was fully examined.  FINDINGS:  normal cecum.  A normal appearing cecum, ileocecal valve, and appendiceal orifice were identified. The ascending, hepatic flexure, transverse, splenic flexure, descending, sigmoid colon, and rectum appeared unremarkable.   Retroflexed views in the rectum revealed no abnormalities.    The time to cecum =  1) 3 minutes. The scope was then withdrawn in  1) 5  minutes from the cecum and the procedure completed. COMPLICATIONS:  None ENDOSCOPIC IMPRESSION: 1) Normal cecum 2) Normal colon RECOMMENDATIONS:  REPEAT EXAM:  In 10 year(s) for Colonoscopy.  ______________________________ Franky Macho, MD  CC:  Assunta Found, MD  n. Rosalie DoctorFranky Macho at 12/08/2011 09:28 AM  Otho Najjar, [phone]

## 2011-12-10 ENCOUNTER — Encounter (HOSPITAL_COMMUNITY): Payer: Self-pay | Admitting: General Surgery

## 2015-05-27 ENCOUNTER — Encounter (INDEPENDENT_AMBULATORY_CARE_PROVIDER_SITE_OTHER): Payer: Self-pay | Admitting: *Deleted

## 2017-02-10 DIAGNOSIS — Z23 Encounter for immunization: Secondary | ICD-10-CM | POA: Diagnosis not present

## 2017-04-01 ENCOUNTER — Emergency Department (HOSPITAL_COMMUNITY)
Admission: EM | Admit: 2017-04-01 | Discharge: 2017-04-01 | Disposition: A | Discharge status: Home / Self Care | Payer: 59 | Attending: Emergency Medicine | Admitting: Emergency Medicine

## 2017-04-01 ENCOUNTER — Encounter (HOSPITAL_COMMUNITY): Payer: Self-pay | Admitting: *Deleted

## 2017-04-01 ENCOUNTER — Other Ambulatory Visit: Payer: Self-pay

## 2017-04-01 DIAGNOSIS — Y999 Unspecified external cause status: Secondary | ICD-10-CM | POA: Insufficient documentation

## 2017-04-01 DIAGNOSIS — Y9389 Activity, other specified: Secondary | ICD-10-CM | POA: Insufficient documentation

## 2017-04-01 DIAGNOSIS — I10 Essential (primary) hypertension: Secondary | ICD-10-CM | POA: Insufficient documentation

## 2017-04-01 DIAGNOSIS — Z79899 Other long term (current) drug therapy: Secondary | ICD-10-CM | POA: Diagnosis not present

## 2017-04-01 DIAGNOSIS — X58XXXA Exposure to other specified factors, initial encounter: Secondary | ICD-10-CM | POA: Insufficient documentation

## 2017-04-01 DIAGNOSIS — S90821A Blister (nonthermal), right foot, initial encounter: Secondary | ICD-10-CM | POA: Insufficient documentation

## 2017-04-01 DIAGNOSIS — Y9289 Other specified places as the place of occurrence of the external cause: Secondary | ICD-10-CM | POA: Diagnosis not present

## 2017-04-01 DIAGNOSIS — J45909 Unspecified asthma, uncomplicated: Secondary | ICD-10-CM | POA: Insufficient documentation

## 2017-04-01 DIAGNOSIS — Z87891 Personal history of nicotine dependence: Secondary | ICD-10-CM | POA: Insufficient documentation

## 2017-04-01 LAB — CBG MONITORING, ED: Glucose-Capillary: 88 mg/dL (ref 65–99)

## 2017-04-01 MED ORDER — DOXYCYCLINE HYCLATE 100 MG PO CAPS: 100.0000 mg | ORAL_CAPSULE | Freq: Two times a day (BID) | ORAL | 0 refills | Status: DC

## 2017-04-01 MED ORDER — AMOXICILLIN-POT CLAVULANATE 875-125 MG PO TABS: 1.0000 | ORAL_TABLET | Freq: Two times a day (BID) | ORAL | 0 refills | Status: DC

## 2017-04-01 MED ORDER — CLOTRIMAZOLE 1 % EX CREA: TOPICAL_CREAM | CUTANEOUS | 0 refills | Status: DC

## 2017-04-01 MED ORDER — DOXYCYCLINE HYCLATE 100 MG PO TABS
100.0000 mg | ORAL_TABLET | Freq: Once | ORAL | Status: AC
  Administered 2017-04-01: 100 mg via ORAL
  Filled 2017-04-01: qty 1

## 2017-04-01 MED ORDER — AMOXICILLIN-POT CLAVULANATE 875-125 MG PO TABS
1.0000 | ORAL_TABLET | Freq: Once | ORAL | Status: AC
  Administered 2017-04-01: 1 via ORAL
  Filled 2017-04-01: qty 1

## 2017-04-01 NOTE — ED Provider Notes (Signed)
[name]   CSN: [id number] Arrival date & time: 04/01/17  0224     History   Chief Complaint Chief Complaint  Patient presents with  . Foot Pain    HPI Dalton Gregory is a 61 y.o. male.  Patient presents with blister and wound between his second and third toes on the dorsal surface that he noticed today that woke him from sleep.  States he had some itching between his toes 2 days ago and he soaked it in Epsom salts.  He soaked it again last night before he went to bed and there was no blister.  He woke up from sleep with pain and noticed a blister between his second and third toes.  He denies any falls or trauma.  He is not a diabetic.  No fever, weakness, numbness, tingling, vomiting or diarrhea.  Denies any new shoes or boots.  He is concerned because he is supposed to go hunting this morning.   The history is provided by the patient.  Foot Pain  Pertinent negatives include no chest pain, no abdominal pain, no headaches and no shortness of breath.    Past Medical History:  Diagnosis Date  . Acid reflux   . Asthma   . Hypertension     Patient Active Problem List   Diagnosis Date Noted  . DYSPEPSIA 07/23/2008  . DIARRHEA 07/23/2008  . ABDOMINAL PAIN 07/23/2008    Past Surgical History:  Procedure Laterality Date  . COLONOSCOPY    . COLONOSCOPY  12/08/2011   Procedure: COLONOSCOPY;  Surgeon: Jamesetta So, MD;  Location: [facility] ENDO SUITE;  Service: Gastroenterology;  Laterality: N/A;       Home Medications    Prior to Admission medications   Medication Sig Start Date End Date Taking? Authorizing Provider  amLODipine (NORVASC) 10 MG tablet Take 10 mg by mouth daily.   Yes [provider]  albuterol (PROVENTIL HFA;VENTOLIN HFA) 108 (90 BASE) MCG/ACT inhaler Inhale 2 puffs into the lungs every 6 (six) hours as needed. For shortness of breath    [provider]  naproxen sodium (ANAPROX) 220 MG tablet Take 220  mg by mouth every 8 (eight) hours as needed. For pain    [provider]    Family History Family History  Problem Relation Age of Onset  . Colon cancer Neg Hx     Social History Social History   Tobacco Use  . Smoking status: Former Smoker    Packs/day: 1.00    Years: 20.00    Pack years: 20.00    Types: Cigarettes  . Smokeless tobacco: Never Used  Substance Use Topics  . Alcohol use: Yes    Comment: 1 pint liquor per week  . Drug use: No     Allergies   Nexium [esomeprazole magnesium]   Review of Systems Review of Systems  Constitutional: Negative for activity change, appetite change and fever.  HENT: Negative for congestion.   Respiratory: Negative for cough, chest tightness and shortness of breath.   Cardiovascular: Negative for chest pain.  Gastrointestinal: Negative for abdominal pain, nausea and vomiting.  Genitourinary: Negative for dysuria and hematuria.  Musculoskeletal: Negative for arthralgias and myalgias.  Skin: Positive for wound. Negative for rash.  Neurological: Negative for dizziness, weakness and headaches.    all other systems are negative except as noted in the HPI and PMH.    Physical Exam Updated Vital Signs BP (!) 142/78 (BP Location: Left Arm)  Pulse 83   Temp 98.1 F (36.7 C) (Oral)   Resp 18   Ht 5\' 9"  (1.753 m)   Wt 78.5 kg (173 lb)   SpO2 98%   BMI 25.55 kg/m   Physical Exam  Constitutional: He is oriented to person, place, and time. He appears well-developed and well-nourished. No distress.  HENT:  Head: Normocephalic and atraumatic.  Mouth/Throat: Oropharynx is clear and moist. No oropharyngeal exudate.  Eyes: Conjunctivae and EOM are normal. Pupils are equal, round, and reactive to light.  Neck: Normal range of motion. Neck supple.  No meningismus.  Cardiovascular: Normal rate, regular rhythm, normal heart sounds and intact distal pulses.  No murmur heard. Pulmonary/Chest: Effort normal and breath sounds  normal. No respiratory distress.  Abdominal: Soft. There is no tenderness. There is no rebound and no guarding.  Musculoskeletal: Normal range of motion. He exhibits tenderness. He exhibits no edema.  There is a bulla between patient's second and third toes on the dorsal surface.  There is clear and purulent drainage from between the toes. No significant erythema or edema Intact DP and PT pulse. Toenails are overgrown  Neurological: He is alert and oriented to person, place, and time. No cranial nerve deficit. He exhibits normal muscle tone. Coordination normal.  No ataxia on finger to nose bilaterally. No pronator drift. 5/5 strength throughout. CN 2-12 intact.Equal grip strength. Sensation intact.   Skin: Skin is warm.  Psychiatric: He has a normal mood and affect. His behavior is normal.  Nursing note and vitals reviewed.    ED Treatments / Results  Labs (all labs ordered are listed, but only abnormal results are displayed) Labs Reviewed  CBG MONITORING, ED    EKG  EKG Interpretation None       Radiology No results found.  Procedures .Marland KitchenIncision and Drainage Date/Time: 04/01/2017 3:46 AM Performed by: Ezequiel Essex, MD Authorized by: Ezequiel Essex, MD   Consent:    Consent obtained:  Verbal   Consent given by:  Patient   Risks discussed:  Bleeding, pain, incomplete drainage and infection   Alternatives discussed:  Alternative treatment Location:    Type:  Abscess   Size:  1 cm   Location:  Lower extremity   Lower extremity location:  Foot   Foot location:  R foot Pre-procedure details:    Skin preparation:  Betadine and antiseptic wash Anesthesia (see MAR for exact dosages):    Anesthesia method:  None Procedure type:    Complexity:  Simple Procedure details:    Needle aspiration: no     Incision type: already open.   Incision depth:  Subcutaneous   Wound management:  Irrigated with saline, extensive cleaning and probed and deloculated   Drainage:   Purulent   Drainage amount:  Moderate   Wound treatment:  Wound left open   Packing materials:  None Post-procedure details:    Patient tolerance of procedure:  Tolerated well, no immediate complications   (including critical care time)  Medications Ordered in ED Medications  amoxicillin-clavulanate (AUGMENTIN) 875-125 MG per tablet 1 tablet (not administered)  doxycycline (VIBRA-TABS) tablet 100 mg (not administered)     Initial Impression / Assessment and Plan / ED Course  I have reviewed the triage vital signs and the nursing notes.  Pertinent labs & imaging results that were available during my care of the patient were reviewed by me and considered in my medical decision making (see chart for details).    Patient with lesion between his second  and third toes that is draining purulent material.  Appears well and neurovascularly intact.  CBG 88. Wound was cleaned at bedside and additional purulence and clear drainage was expressed from between second and third toes.  Tetanus is up-to-date.  Patient will be given antibiotics and podiatry follow-up.  Continue warm soaks with Epsom salts. Discussed may benefit from antifungal cream after lesion has healed and stop draining.  Return precautions discussed Question superinfection of tinea pedis but there is no other signs of tinea pedis between other toes  Final Clinical Impressions(s) / ED Diagnoses   Final diagnoses:  Blister of right foot, initial encounter    ED Discharge Orders    None       Lashunda Greis, Annie Main, MD 04/01/17 [phone]

## 2017-04-01 NOTE — ED Triage Notes (Signed)
Pt c/o pain to right 3rd toe; pt states the toe started itching yesterday and he soaked it in epsom salt and today he woke up today with a blister; pt c/o pain

## 2017-04-01 NOTE — Discharge Instructions (Signed)
Take the antibiotics as prescribed.  Continue to do your warm soaks with Epsom salts twice a day.  Follow-up with Dr. Caprice Beaver.  Do not apply the antifungal cream until the lesion has stopped draining.  Return to the ED if you develop worsening symptoms including pain or fever.

## 2017-05-07 DIAGNOSIS — K219 Gastro-esophageal reflux disease without esophagitis: Secondary | ICD-10-CM | POA: Diagnosis not present

## 2017-05-07 DIAGNOSIS — Z1389 Encounter for screening for other disorder: Secondary | ICD-10-CM | POA: Diagnosis not present

## 2017-05-07 DIAGNOSIS — J449 Chronic obstructive pulmonary disease, unspecified: Secondary | ICD-10-CM | POA: Diagnosis not present

## 2017-05-07 DIAGNOSIS — I10 Essential (primary) hypertension: Secondary | ICD-10-CM | POA: Diagnosis not present

## 2017-11-17 DIAGNOSIS — Z6828 Body mass index (BMI) 28.0-28.9, adult: Secondary | ICD-10-CM | POA: Diagnosis not present

## 2017-11-17 DIAGNOSIS — M25561 Pain in right knee: Secondary | ICD-10-CM | POA: Diagnosis not present

## 2017-11-17 DIAGNOSIS — M7051 Other bursitis of knee, right knee: Secondary | ICD-10-CM | POA: Diagnosis not present

## 2018-04-21 DIAGNOSIS — J449 Chronic obstructive pulmonary disease, unspecified: Secondary | ICD-10-CM | POA: Diagnosis not present

## 2018-04-21 DIAGNOSIS — Z1389 Encounter for screening for other disorder: Secondary | ICD-10-CM | POA: Diagnosis not present

## 2018-04-21 DIAGNOSIS — I10 Essential (primary) hypertension: Secondary | ICD-10-CM | POA: Diagnosis not present

## 2018-04-21 DIAGNOSIS — E663 Overweight: Secondary | ICD-10-CM | POA: Diagnosis not present

## 2018-04-21 DIAGNOSIS — R319 Hematuria, unspecified: Secondary | ICD-10-CM | POA: Diagnosis not present

## 2018-04-29 DIAGNOSIS — R319 Hematuria, unspecified: Secondary | ICD-10-CM | POA: Diagnosis not present

## 2018-07-12 ENCOUNTER — Emergency Department (HOSPITAL_COMMUNITY): Payer: Medicare Other

## 2018-07-12 ENCOUNTER — Emergency Department (HOSPITAL_COMMUNITY)
Admission: EM | Admit: 2018-07-12 | Discharge: 2018-07-12 | Disposition: A | Discharge status: Home / Self Care | Payer: Medicare Other | Attending: Emergency Medicine | Admitting: Emergency Medicine

## 2018-07-12 ENCOUNTER — Encounter (HOSPITAL_COMMUNITY): Payer: Self-pay | Admitting: *Deleted

## 2018-07-12 ENCOUNTER — Other Ambulatory Visit: Payer: Self-pay

## 2018-07-12 DIAGNOSIS — J181 Lobar pneumonia, unspecified organism: Secondary | ICD-10-CM | POA: Insufficient documentation

## 2018-07-12 DIAGNOSIS — Z79899 Other long term (current) drug therapy: Secondary | ICD-10-CM | POA: Insufficient documentation

## 2018-07-12 DIAGNOSIS — J189 Pneumonia, unspecified organism: Secondary | ICD-10-CM | POA: Diagnosis not present

## 2018-07-12 DIAGNOSIS — R0602 Shortness of breath: Secondary | ICD-10-CM | POA: Diagnosis not present

## 2018-07-12 DIAGNOSIS — I10 Essential (primary) hypertension: Secondary | ICD-10-CM | POA: Insufficient documentation

## 2018-07-12 DIAGNOSIS — J45909 Unspecified asthma, uncomplicated: Secondary | ICD-10-CM | POA: Diagnosis not present

## 2018-07-12 DIAGNOSIS — R05 Cough: Secondary | ICD-10-CM | POA: Diagnosis not present

## 2018-07-12 DIAGNOSIS — Z87891 Personal history of nicotine dependence: Secondary | ICD-10-CM | POA: Insufficient documentation

## 2018-07-12 MED ORDER — DOXYCYCLINE HYCLATE 100 MG PO CAPS: 100.0000 mg | ORAL_CAPSULE | Freq: Two times a day (BID) | ORAL | 0 refills | Status: DC

## 2018-07-12 MED ORDER — PREDNISONE 20 MG PO TABS: 40.0000 mg | ORAL_TABLET | Freq: Every day | ORAL | 0 refills | Status: DC

## 2018-07-12 MED ORDER — IPRATROPIUM-ALBUTEROL 0.5-2.5 (3) MG/3ML IN SOLN
3.0000 mL | Freq: Once | RESPIRATORY_TRACT | Status: AC
  Administered 2018-07-12: 3 mL via RESPIRATORY_TRACT
  Filled 2018-07-12: qty 3

## 2018-07-12 MED ORDER — PREDNISONE 50 MG PO TABS
60.0000 mg | ORAL_TABLET | Freq: Once | ORAL | Status: AC
  Administered 2018-07-12: 60 mg via ORAL
  Filled 2018-07-12: qty 1

## 2018-07-12 MED ORDER — DOXYCYCLINE HYCLATE 100 MG PO TABS
100.0000 mg | ORAL_TABLET | Freq: Once | ORAL | Status: AC
  Administered 2018-07-12: 100 mg via ORAL
  Filled 2018-07-12: qty 1

## 2018-07-12 MED ORDER — ALBUTEROL SULFATE (2.5 MG/3ML) 0.083% IN NEBU
2.5000 mg | INHALATION_SOLUTION | Freq: Once | RESPIRATORY_TRACT | Status: AC
  Administered 2018-07-12: 2.5 mg via RESPIRATORY_TRACT
  Filled 2018-07-12: qty 3

## 2018-07-12 NOTE — ED Triage Notes (Signed)
Pt c/o sob that started today; pt states he was around a sick co-worker right before the symptoms started; pt states he has been coughing up clear sputum

## 2018-07-12 NOTE — ED Provider Notes (Signed)
[facility] EMERGENCY DEPARTMENT Provider Note   CSN: [id number] Arrival date & time: 07/12/18  0154    History   Chief Complaint Chief Complaint  Patient presents with  . Shortness of Breath    HPI Dalton Gregory is a 63 y.o. male.     Patient presents to the emergency department for evaluation of shortness of breath.  Patient reports that he has had cough and some congestion over the last few days after being exposed to a sick coworker.  Initially he was using his albuterol with relief, but tonight his shortness of breath worsened.  No associated chest pain.  No fever.     Past Medical History:  Diagnosis Date  . Acid reflux   . Asthma   . Hypertension     Patient Active Problem List   Diagnosis Date Noted  . DYSPEPSIA 07/23/2008  . DIARRHEA 07/23/2008  . ABDOMINAL PAIN 07/23/2008    Past Surgical History:  Procedure Laterality Date  . COLONOSCOPY    . COLONOSCOPY  12/08/2011   Procedure: COLONOSCOPY;  Surgeon: Jamesetta So, MD;  Location: [facility];  Service: Gastroenterology;  Laterality: N/A;        Home Medications    Prior to Admission medications   Medication Sig Start Date End Date Taking? Authorizing Provider  albuterol (PROVENTIL HFA;VENTOLIN HFA) 108 (90 BASE) MCG/ACT inhaler Inhale 2 puffs into the lungs every 6 (six) hours as needed. For shortness of breath    [provider]  amLODipine (NORVASC) 10 MG tablet Take 10 mg by mouth daily.    [provider]  clotrimazole (LOTRIMIN) 1 % cream Apply to affected area 2 times daily 04/01/17   Rancour, Annie Main, MD  doxycycline (VIBRAMYCIN) 100 MG capsule Take 1 capsule (100 mg total) by mouth 2 (two) times daily. 07/12/18   Orpah Greek, MD  naproxen sodium (ANAPROX) 220 MG tablet Take 220 mg by mouth every 8 (eight) hours as needed. For pain    [provider]  predniSONE (DELTASONE) 20 MG tablet Take 2 tablets (40 mg total) by mouth daily with breakfast. 07/12/18    Pollina, Gwenyth Allegra, MD    Family History Family History  Problem Relation Age of Onset  . Colon cancer Neg Hx     Social History Social History   Tobacco Use  . Smoking status: Former Smoker    Packs/day: 1.00    Years: 20.00    Pack years: 20.00    Types: Cigarettes  . Smokeless tobacco: Never Used  Substance Use Topics  . Alcohol use: Yes    Comment: 1 pint liquor per week  . Drug use: No     Allergies   Nexium [esomeprazole magnesium]   Review of Systems Review of Systems  Respiratory: Positive for cough, shortness of breath and wheezing.   All other systems reviewed and are negative.    Physical Exam Updated Vital Signs BP (!) 120/106   Pulse 95   Temp 99.8 F (37.7 C)   Resp (!) 24   Ht 5\' 9"  (1.753 m)   Wt 78 kg   SpO2 98%   BMI 25.40 kg/m   Physical Exam Vitals signs and nursing note reviewed.  Constitutional:      General: He is not in acute distress.    Appearance: Normal appearance. He is well-developed.  HENT:     Head: Normocephalic and atraumatic.     Right Ear: Hearing normal.     Left  Ear: Hearing normal.     Nose: Nose normal.  Eyes:     Conjunctiva/sclera: Conjunctivae normal.     Pupils: Pupils are equal, round, and reactive to light.  Neck:     Musculoskeletal: Normal range of motion and neck supple.  Cardiovascular:     Rate and Rhythm: Regular rhythm.     Heart sounds: S1 normal and S2 normal. No murmur. No friction rub. No gallop.   Pulmonary:     Effort: Pulmonary effort is normal. No respiratory distress.     Breath sounds: Examination of the right-lower field reveals wheezing. Examination of the left-lower field reveals wheezing. Wheezing present.  Chest:     Chest wall: No tenderness.  Abdominal:     General: Bowel sounds are normal.     Palpations: Abdomen is soft.     Tenderness: There is no abdominal tenderness. There is no guarding or rebound. Negative signs include Murphy's sign and McBurney's sign.      Hernia: No hernia is present.  Musculoskeletal: Normal range of motion.  Skin:    General: Skin is warm and dry.     Findings: No rash.  Neurological:     Mental Status: He is alert and oriented to person, place, and time.     GCS: GCS eye subscore is 4. GCS verbal subscore is 5. GCS motor subscore is 6.     Cranial Nerves: No cranial nerve deficit.     Sensory: No sensory deficit.     Coordination: Coordination normal.  Psychiatric:        Speech: Speech normal.        Behavior: Behavior normal.        Thought Content: Thought content normal.      ED Treatments / Results  Labs (all labs ordered are listed, but only abnormal results are displayed) Labs Reviewed - No data to display  EKG None  Radiology Dg Chest 2 View  Result Date: 07/12/2018 CLINICAL DATA:  Shortness of breath. Cough. EXAM: CHEST - 2 VIEW COMPARISON:  Chest radiograph 09/16/2007 FINDINGS: Chronic hyperinflation. Mild bronchitic change. Vague left perihilar opacity was not seen on prior exam. No pulmonary edema, pleural effusion, or pneumothorax. Probable nipple shadow projecting over the right mid chest. Incidental bifid left anterior fourth rib. No acute osseous abnormalities. IMPRESSION: 1. Vague left perihilar opacity. This may reflect pneumonia in the appropriate clinical setting. Recommend radiographic follow-up with PA and lateral views in 3-4 weeks after course of treatment to ensure resolution and exclude underlying neoplastic process. 2. Probable nipple shadow projecting over the right mid chest. 3. Chronic hyperinflation. Bronchial thickening is increased from remote prior exam. Electronically Signed   By: Keith Rake M.D.   On: 07/12/2018 02:51    Procedures Procedures (including critical care time)  Medications Ordered in ED Medications  ipratropium-albuterol (DUONEB) 0.5-2.5 (3) MG/3ML nebulizer solution 3 mL (3 mLs Nebulization Given 07/12/18 0240)  albuterol (PROVENTIL) (2.5 MG/3ML) 0.083%  nebulizer solution 2.5 mg (2.5 mg Nebulization Given 07/12/18 0240)  predniSONE (DELTASONE) tablet 60 mg (60 mg Oral Given 07/12/18 0231)     Initial Impression / Assessment and Plan / ED Course  I have reviewed the triage vital signs and the nursing notes.  Pertinent labs & imaging results that were available during my care of the patient were reviewed by me and considered in my medical decision making (see chart for details).        Patient presents to the emergency department for evaluation  of difficulty breathing.  He has had cough and chest congestion for the last couple of days and now is experiencing increased shortness of breath tonight.  Examination does reveal wheezing without significant hypoxia.  He is in no respiratory distress.  Chest x-ray suspicious for left perihilar pneumonia.  As he is in no distress, appropriate for outpatient management.  Will treat with prednisone, doxycycline, bronchodilator therapy.  Final Clinical Impressions(s) / ED Diagnoses   Final diagnoses:  Community acquired pneumonia of left lower lobe of lung [facility])    ED Discharge Orders         Ordered    doxycycline (VIBRAMYCIN) 100 MG capsule  2 times daily     07/12/18 0258    predniSONE (DELTASONE) 20 MG tablet  Daily with breakfast     07/12/18 0258           Orpah Greek, MD 07/12/18 [phone]

## 2018-07-18 DIAGNOSIS — J069 Acute upper respiratory infection, unspecified: Secondary | ICD-10-CM | POA: Diagnosis not present

## 2018-07-18 DIAGNOSIS — Z6827 Body mass index (BMI) 27.0-27.9, adult: Secondary | ICD-10-CM | POA: Diagnosis not present

## 2018-07-18 DIAGNOSIS — Z1389 Encounter for screening for other disorder: Secondary | ICD-10-CM | POA: Diagnosis not present

## 2018-07-18 DIAGNOSIS — E663 Overweight: Secondary | ICD-10-CM | POA: Diagnosis not present

## 2019-03-04 DIAGNOSIS — Z23 Encounter for immunization: Secondary | ICD-10-CM | POA: Diagnosis not present

## 2019-03-08 ENCOUNTER — Other Ambulatory Visit: Payer: Self-pay

## 2019-03-08 DIAGNOSIS — Z20822 Contact with and (suspected) exposure to covid-19: Secondary | ICD-10-CM

## 2019-03-08 DIAGNOSIS — Z20828 Contact with and (suspected) exposure to other viral communicable diseases: Secondary | ICD-10-CM | POA: Diagnosis not present

## 2019-03-10 LAB — NOVEL CORONAVIRUS, NAA: SARS-CoV-2, NAA: NOT DETECTED

## 2019-07-07 ENCOUNTER — Other Ambulatory Visit: Payer: Self-pay

## 2019-07-07 ENCOUNTER — Encounter (HOSPITAL_COMMUNITY): Payer: Self-pay | Admitting: Emergency Medicine

## 2019-07-07 ENCOUNTER — Emergency Department (HOSPITAL_COMMUNITY): Payer: Medicare Other

## 2019-07-07 ENCOUNTER — Emergency Department (HOSPITAL_COMMUNITY)
Admission: EM | Admit: 2019-07-07 | Discharge: 2019-07-07 | Disposition: A | Discharge status: Home / Self Care | Payer: Medicare Other | Attending: Emergency Medicine | Admitting: Emergency Medicine

## 2019-07-07 DIAGNOSIS — I10 Essential (primary) hypertension: Secondary | ICD-10-CM | POA: Insufficient documentation

## 2019-07-07 DIAGNOSIS — Z87891 Personal history of nicotine dependence: Secondary | ICD-10-CM | POA: Diagnosis not present

## 2019-07-07 DIAGNOSIS — J45901 Unspecified asthma with (acute) exacerbation: Secondary | ICD-10-CM | POA: Diagnosis not present

## 2019-07-07 DIAGNOSIS — R05 Cough: Secondary | ICD-10-CM | POA: Diagnosis not present

## 2019-07-07 DIAGNOSIS — Z79899 Other long term (current) drug therapy: Secondary | ICD-10-CM | POA: Insufficient documentation

## 2019-07-07 LAB — BASIC METABOLIC PANEL
Anion gap: 9 (ref 5–15)
BUN: 13 mg/dL (ref 8–23)
CO2: 29 mmol/L (ref 22–32)
Calcium: 9.3 mg/dL (ref 8.9–10.3)
Chloride: 103 mmol/L (ref 98–111)
Creatinine, Ser: 0.71 mg/dL (ref 0.61–1.24)
GFR calc Af Amer: >60 mL/min (ref >60)
GFR calc non Af Amer: >60 mL/min (ref >60)
Glucose, Bld: 96 mg/dL (ref 70–99)
Potassium: 3.6 mmol/L (ref 3.5–5.1)
Sodium: 141 mmol/L (ref 135–145)

## 2019-07-07 LAB — CBC WITH DIFFERENTIAL/PLATELET
Abs Immature Granulocytes: 0.02 10*3/uL (ref 0.00–0.07)
Basophils Absolute: 0 10*3/uL (ref 0.0–0.1)
Basophils Relative: 1 %
Eosinophils Absolute: 0.3 10*3/uL (ref 0.0–0.5)
Eosinophils Relative: 5 %
HCT: 39.1 % (ref 39.0–52.0)
Hemoglobin: 13.1 g/dL (ref 13.0–17.0)
Immature Granulocytes: 0 %
Lymphocytes Relative: 16 %
Lymphs Abs: 1 10*3/uL (ref 0.7–4.0)
MCH: 31 pg (ref 26.0–34.0)
MCHC: 33.5 g/dL (ref 30.0–36.0)
MCV: 92.4 fL (ref 80.0–100.0)
Monocytes Absolute: 0.7 10*3/uL (ref 0.1–1.0)
Monocytes Relative: 11 %
Neutro Abs: 4.3 10*3/uL (ref 1.7–7.7)
Neutrophils Relative %: 67 %
Platelets: 281 10*3/uL (ref 150–400)
RBC: 4.23 MIL/uL (ref 4.22–5.81)
RDW: 13 % (ref 11.5–15.5)
WBC: 6.3 10*3/uL (ref 4.0–10.5)
nRBC: 0 % (ref 0.0–0.2)

## 2019-07-07 MED ORDER — ALBUTEROL SULFATE HFA 108 (90 BASE) MCG/ACT IN AERS
6.0000 | INHALATION_SPRAY | Freq: Once | RESPIRATORY_TRACT | Status: AC
  Administered 2019-07-07: 6 via RESPIRATORY_TRACT
  Filled 2019-07-07 (×2): qty 6.7

## 2019-07-07 MED ORDER — PREDNISONE 50 MG PO TABS
60.0000 mg | ORAL_TABLET | Freq: Once | ORAL | Status: AC
  Administered 2019-07-07: 10:00:00 60 mg via ORAL
  Filled 2019-07-07: qty 1

## 2019-07-07 MED ORDER — PREDNISONE 20 MG PO TABS: 20.0000 mg | ORAL_TABLET | Freq: Two times a day (BID) | ORAL | 0 refills | Status: DC

## 2019-07-07 NOTE — ED Triage Notes (Signed)
Pt reports cough, chills, dyspnea, body aches since last night.

## 2019-07-07 NOTE — ED Provider Notes (Signed)
[facility] Provider Note   CSN: [id number] Arrival date & time: 07/07/19  [id number]     History Chief Complaint  Patient presents with  . Cough    Dalton Gregory is a 64 y.o. male.  HPI He presents for evaluation of coughing and wheezing, onset last night.  He had his first Covid immunization, 3 weeks ago.  He has a history of asthma and has been using albuterol, without relief.  He tried to work this morning but felt too short of breath so he came here.  He complains of a feeling of "tightness" in his chest.  He does not describe this as painful.  He denies fevers, chills, nausea, vomiting or anorexia.  He has not eaten yet this morning but typically does not eat prior to going to work.  No other recent illnesses.  There are no other known modifying factors.    Past Medical History:  Diagnosis Date  . Acid reflux   . Asthma   . Hypertension     Patient Active Problem List   Diagnosis Date Noted  . DYSPEPSIA 07/23/2008  . DIARRHEA 07/23/2008  . ABDOMINAL PAIN 07/23/2008    Past Surgical History:  Procedure Laterality Date  . COLONOSCOPY    . COLONOSCOPY  12/08/2011   Procedure: COLONOSCOPY;  Surgeon: Jamesetta So, MD;  Location: [facility];  Service: Gastroenterology;  Laterality: N/A;       Family History  Problem Relation Age of Onset  . Colon cancer Neg Hx     Social History   Tobacco Use  . Smoking status: Former Smoker    Packs/day: 1.00    Years: 20.00    Pack years: 20.00    Types: Cigarettes  . Smokeless tobacco: Never Used  Substance Use Topics  . Alcohol use: Yes    Comment: 1 pint liquor per week  . Drug use: No    Home Medications Prior to Admission medications   Medication Sig Start Date End Date Taking? Authorizing Provider  albuterol (PROVENTIL HFA;VENTOLIN HFA) 108 (90 BASE) MCG/ACT inhaler Inhale 2 puffs into the lungs every 6 (six) hours as needed. For shortness of breath    [provider]  amLODipine  (NORVASC) 10 MG tablet Take 10 mg by mouth daily.    [provider]  clotrimazole (LOTRIMIN) 1 % cream Apply to affected area 2 times daily 04/01/17   Rancour, Annie Main, MD  doxycycline (VIBRAMYCIN) 100 MG capsule Take 1 capsule (100 mg total) by mouth 2 (two) times daily. 07/12/18   Orpah Greek, MD  naproxen sodium (ANAPROX) 220 MG tablet Take 220 mg by mouth every 8 (eight) hours as needed. For pain    [provider]  predniSONE (DELTASONE) 20 MG tablet Take 1 tablet (20 mg total) by mouth 2 (two) times daily. 07/07/19   Daleen Bo, MD    Allergies    Nexium [esomeprazole magnesium]  Review of Systems   Review of Systems  All other systems reviewed and are negative.   Physical Exam Updated Vital Signs BP 128/72   Pulse 75   Temp 98.4 F (36.9 C) (Oral)   Resp 15   Ht 5\' 9"  (1.753 m)   Wt 77.1 kg   SpO2 97%   BMI 25.10 kg/m   Physical Exam Vitals and nursing note reviewed.  Constitutional:      General: He is not in acute distress.    Appearance: He is well-developed. He is not  ill-appearing, toxic-appearing or diaphoretic.  HENT:     Head: Normocephalic and atraumatic.     Right Ear: External ear normal.     Left Ear: External ear normal.  Eyes:     Conjunctiva/sclera: Conjunctivae normal.     Pupils: Pupils are equal, round, and reactive to light.  Neck:     Trachea: Phonation normal.  Cardiovascular:     Rate and Rhythm: Normal rate and regular rhythm.     Heart sounds: Normal heart sounds.  Pulmonary:     Effort: Pulmonary effort is normal. No respiratory distress.     Breath sounds: No stridor.     Comments: Scattered rhonchi with few wheezes and mildly diminished air movement bilaterally.  There is no increased work of breathing. Abdominal:     General: There is no distension.     Palpations: Abdomen is soft.     Tenderness: There is no abdominal tenderness.  Musculoskeletal:        General: Normal range of motion.      Cervical back: Normal range of motion and neck supple.  Skin:    General: Skin is warm and dry.  Neurological:     Mental Status: He is alert and oriented to person, place, and time.     Cranial Nerves: No cranial nerve deficit.     Sensory: No sensory deficit.     Motor: No abnormal muscle tone.     Coordination: Coordination normal.  Psychiatric:        Mood and Affect: Mood normal.        Behavior: Behavior normal.        Thought Content: Thought content normal.        Judgment: Judgment normal.     ED Results / Procedures / Treatments   Labs (all labs ordered are listed, but only abnormal results are displayed) Labs Reviewed  CBC WITH DIFFERENTIAL/PLATELET  BASIC METABOLIC PANEL    EKG None  Radiology DG Chest 2 View  Result Date: 07/07/2019 CLINICAL DATA:  Wheezing, cough EXAM: CHEST - 2 VIEW COMPARISON:  07/12/2018 FINDINGS: The heart size and mediastinal contours are within normal limits. Probable bilateral nipple shadows, unchanged. The previously seen left perihilar opacity on previous study has resolved. No new focal airspace consolidation, pleural effusion, or pneumothorax. The visualized skeletal structures are unremarkable. IMPRESSION: No active cardiopulmonary disease. Electronically Signed   By: Davina Poke D.O.   On: 07/07/2019 10:12    Procedures Procedures (including critical care time)  Medications Ordered in ED Medications  predniSONE (DELTASONE) tablet 60 mg (60 mg Oral Given 07/07/19 0947)  albuterol (VENTOLIN HFA) 108 (90 Base) MCG/ACT inhaler 6 puff (6 puffs Inhalation Given 07/07/19 1036)    ED Course  I have reviewed the triage vital signs and the nursing notes.  Pertinent labs & imaging results that were available during my care of the patient were reviewed by me and considered in my medical decision making (see chart for details).  Clinical Course as of Jul 06 1141  Fri Jul 07, 2019  1111 Normal  Basic metabolic panel [EW]  [id number]  Normal  CBC with Differential [EW]  1115 No CHF or infiltrate, interpreted by me  DG Chest 2 View [EW]    Clinical Course User Index [EW] Daleen Bo, MD   MDM Rules/Calculators/A&P                       Patient Vitals for the past 24 hrs:  BP Temp Temp src Pulse Resp SpO2 Height Weight  07/07/19 1130 128/72 -- -- 75 15 97 % -- --  07/07/19 1119 136/72 -- -- 82 14 98 % -- --  07/07/19 1036 -- -- -- -- -- 100 % -- --  07/07/19 0914 (!) 142/80 98.4 F (36.9 C) Oral 76 15 98 % -- --  07/07/19 0910 -- -- -- -- -- -- 5\' 9"  (1.753 m) 77.1 kg    11:43 AM Reevaluation with update and discussion. After initial assessment and treatment, an updated evaluation reveals he states he feels better and that the tightness in his chest has resolved.  Repeat vital signs are reviewed and are reassuring.  Findings discussed with the patient and all questions were answered. Daleen Bo   Medical Decision Making: Evaluation consistent with exacerbation of asthma/bronchitis.  Doubt serious bacterial infection, metabolic instability or impending vascular collapse.  He is stable for discharge with outpatient management.   [facility] was evaluated in [facility] on 07/07/2019 for the symptoms described in the history of present illness. He was evaluated in the context of the global COVID-19 pandemic, which necessitated consideration that the patient might be at risk for infection with the SARS-CoV-2 virus that causes COVID-19. Institutional protocols and algorithms that pertain to the evaluation of patients at risk for COVID-19 are in a state of rapid change based on information released by regulatory bodies including the CDC and federal and state organizations. These policies and algorithms were followed during the patient's care in the ED.   CRITICAL CARE- No Performed by: Daleen Bo   Nursing Notes Reviewed/ Care Coordinated Applicable Imaging Reviewed Interpretation of Laboratory Data  incorporated into ED treatment  The patient appears reasonably screened and/or stabilized for discharge and I doubt any other medical condition or other [facility] requiring further screening, evaluation, or treatment in the ED at this time prior to discharge.  Plan: Home Medications-continue usual; Home Treatments-rest, fluids; return here if the recommended treatment, does not improve the symptoms; Recommended follow up-PCP, as needed   Final Clinical Impression(s) / ED Diagnoses Final diagnoses:  Moderate asthma with exacerbation, unspecified whether persistent    Rx / DC Orders ED Discharge Orders         Ordered    predniSONE (DELTASONE) 20 MG tablet  2 times daily     07/07/19 1142           Daleen Bo, MD 07/07/19 1143

## 2019-07-07 NOTE — ED Notes (Signed)
Pt given peanut butter crackers and water. Tolerating well.

## 2019-07-07 NOTE — Discharge Instructions (Signed)
Use your inhaler 2 puffs every 3-4 hours as needed for trouble breathing or coughing.  See your doctor if not better in a few days.

## 2019-08-24 DIAGNOSIS — Z1389 Encounter for screening for other disorder: Secondary | ICD-10-CM | POA: Diagnosis not present

## 2019-08-24 DIAGNOSIS — K219 Gastro-esophageal reflux disease without esophagitis: Secondary | ICD-10-CM | POA: Diagnosis not present

## 2019-08-24 DIAGNOSIS — E663 Overweight: Secondary | ICD-10-CM | POA: Diagnosis not present

## 2019-08-24 DIAGNOSIS — I10 Essential (primary) hypertension: Secondary | ICD-10-CM | POA: Diagnosis not present

## 2019-08-24 DIAGNOSIS — J449 Chronic obstructive pulmonary disease, unspecified: Secondary | ICD-10-CM | POA: Diagnosis not present

## 2019-08-24 DIAGNOSIS — Z6828 Body mass index (BMI) 28.0-28.9, adult: Secondary | ICD-10-CM | POA: Diagnosis not present

## 2019-08-24 DIAGNOSIS — Z125 Encounter for screening for malignant neoplasm of prostate: Secondary | ICD-10-CM | POA: Diagnosis not present

## 2019-08-24 DIAGNOSIS — E039 Hypothyroidism, unspecified: Secondary | ICD-10-CM | POA: Diagnosis not present

## 2019-08-24 DIAGNOSIS — Z0001 Encounter for general adult medical examination with abnormal findings: Secondary | ICD-10-CM | POA: Diagnosis not present

## 2019-11-23 DIAGNOSIS — H31011 Macula scars of posterior pole (postinflammatory) (post-traumatic), right eye: Secondary | ICD-10-CM | POA: Diagnosis not present

## 2020-05-01 DIAGNOSIS — Z23 Encounter for immunization: Secondary | ICD-10-CM | POA: Diagnosis not present

## 2020-10-04 DIAGNOSIS — J449 Chronic obstructive pulmonary disease, unspecified: Secondary | ICD-10-CM | POA: Diagnosis not present

## 2020-10-04 DIAGNOSIS — Z1331 Encounter for screening for depression: Secondary | ICD-10-CM | POA: Diagnosis not present

## 2020-10-04 DIAGNOSIS — I10 Essential (primary) hypertension: Secondary | ICD-10-CM | POA: Diagnosis not present

## 2020-10-04 DIAGNOSIS — Z0001 Encounter for general adult medical examination with abnormal findings: Secondary | ICD-10-CM | POA: Diagnosis not present

## 2020-10-04 DIAGNOSIS — E663 Overweight: Secondary | ICD-10-CM | POA: Diagnosis not present

## 2020-10-04 DIAGNOSIS — Z125 Encounter for screening for malignant neoplasm of prostate: Secondary | ICD-10-CM | POA: Diagnosis not present

## 2020-10-04 DIAGNOSIS — Z1389 Encounter for screening for other disorder: Secondary | ICD-10-CM | POA: Diagnosis not present

## 2020-10-04 DIAGNOSIS — E039 Hypothyroidism, unspecified: Secondary | ICD-10-CM | POA: Diagnosis not present

## 2020-10-04 DIAGNOSIS — Z6826 Body mass index (BMI) 26.0-26.9, adult: Secondary | ICD-10-CM | POA: Diagnosis not present

## 2020-10-08 ENCOUNTER — Emergency Department (HOSPITAL_COMMUNITY): Payer: Medicare HMO

## 2020-10-08 ENCOUNTER — Other Ambulatory Visit: Payer: Self-pay

## 2020-10-08 ENCOUNTER — Emergency Department (HOSPITAL_COMMUNITY)
Admission: EM | Admit: 2020-10-08 | Discharge: 2020-10-08 | Disposition: A | Discharge status: Home / Self Care | Payer: Medicare HMO | Attending: Emergency Medicine | Admitting: Emergency Medicine

## 2020-10-08 ENCOUNTER — Encounter (HOSPITAL_COMMUNITY): Payer: Self-pay | Admitting: *Deleted

## 2020-10-08 DIAGNOSIS — I10 Essential (primary) hypertension: Secondary | ICD-10-CM | POA: Insufficient documentation

## 2020-10-08 DIAGNOSIS — Z79899 Other long term (current) drug therapy: Secondary | ICD-10-CM | POA: Insufficient documentation

## 2020-10-08 DIAGNOSIS — R42 Dizziness and giddiness: Secondary | ICD-10-CM | POA: Diagnosis not present

## 2020-10-08 DIAGNOSIS — R200 Anesthesia of skin: Secondary | ICD-10-CM | POA: Insufficient documentation

## 2020-10-08 DIAGNOSIS — R519 Headache, unspecified: Secondary | ICD-10-CM | POA: Insufficient documentation

## 2020-10-08 DIAGNOSIS — Z7982 Long term (current) use of aspirin: Secondary | ICD-10-CM | POA: Insufficient documentation

## 2020-10-08 DIAGNOSIS — R202 Paresthesia of skin: Secondary | ICD-10-CM | POA: Diagnosis not present

## 2020-10-08 DIAGNOSIS — Z87891 Personal history of nicotine dependence: Secondary | ICD-10-CM | POA: Insufficient documentation

## 2020-10-08 DIAGNOSIS — J019 Acute sinusitis, unspecified: Secondary | ICD-10-CM | POA: Diagnosis not present

## 2020-10-08 HISTORY — DX: Pure hypercholesterolemia, unspecified: E78.00

## 2020-10-08 LAB — COMPREHENSIVE METABOLIC PANEL
ALT: 80 U/L — ABNORMAL HIGH (ref 0–44)
AST: 61 U/L — ABNORMAL HIGH (ref 15–41)
Albumin: 4.6 g/dL (ref 3.5–5.0)
Alkaline Phosphatase: 62 U/L (ref 38–126)
Anion gap: 9 (ref 5–15)
BUN: 15 mg/dL (ref 8–23)
CO2: 25 mmol/L (ref 22–32)
Calcium: 9.5 mg/dL (ref 8.9–10.3)
Chloride: 106 mmol/L (ref 98–111)
Creatinine, Ser: 0.84 mg/dL (ref 0.61–1.24)
GFR, Estimated: >60 mL/min (ref >60)
Glucose, Bld: 106 mg/dL — ABNORMAL HIGH (ref 70–99)
Potassium: 3.6 mmol/L (ref 3.5–5.1)
Sodium: 140 mmol/L (ref 135–145)
Total Bilirubin: 0.8 mg/dL (ref 0.3–1.2)
Total Protein: 8 g/dL (ref 6.5–8.1)

## 2020-10-08 LAB — PROTIME-INR
INR: 1.1 (ref 0.8–1.2)
Prothrombin Time: 13.8 seconds (ref 11.4–15.2)

## 2020-10-08 LAB — I-STAT CHEM 8, ED
BUN: 19 mg/dL (ref 8–23)
Calcium, Ion: 1.16 mmol/L (ref 1.15–1.40)
Chloride: 107 mmol/L (ref 98–111)
Creatinine, Ser: 0.8 mg/dL (ref 0.61–1.24)
Glucose, Bld: 99 mg/dL (ref 70–99)
HCT: 44 % (ref 39.0–52.0)
Hemoglobin: 15 g/dL (ref 13.0–17.0)
Potassium: 4.7 mmol/L (ref 3.5–5.1)
Sodium: 142 mmol/L (ref 135–145)
TCO2: 27 mmol/L (ref 22–32)

## 2020-10-08 LAB — DIFFERENTIAL
Abs Immature Granulocytes: 0.04 10*3/uL (ref 0.00–0.07)
Basophils Absolute: 0 10*3/uL (ref 0.0–0.1)
Basophils Relative: 0 %
Eosinophils Absolute: 0.2 10*3/uL (ref 0.0–0.5)
Eosinophils Relative: 2 %
Immature Granulocytes: 0 %
Lymphocytes Relative: 32 %
Lymphs Abs: 3 10*3/uL (ref 0.7–4.0)
Monocytes Absolute: 0.7 10*3/uL (ref 0.1–1.0)
Monocytes Relative: 8 %
Neutro Abs: 5.6 10*3/uL (ref 1.7–7.7)
Neutrophils Relative %: 58 %

## 2020-10-08 LAB — CBC
HCT: 41.7 % (ref 39.0–52.0)
Hemoglobin: 14.2 g/dL (ref 13.0–17.0)
MCH: 31.4 pg (ref 26.0–34.0)
MCHC: 34.1 g/dL (ref 30.0–36.0)
MCV: 92.3 fL (ref 80.0–100.0)
Platelets: 312 10*3/uL (ref 150–400)
RBC: 4.52 MIL/uL (ref 4.22–5.81)
RDW: 12.7 % (ref 11.5–15.5)
WBC: 9.6 10*3/uL (ref 4.0–10.5)
nRBC: 0 % (ref 0.0–0.2)

## 2020-10-08 LAB — ETHANOL: Alcohol, Ethyl (B): <10 mg/dL (ref <10)

## 2020-10-08 LAB — CBG MONITORING, ED: Glucose-Capillary: 96 mg/dL (ref 70–99)

## 2020-10-08 LAB — APTT: aPTT: 26 seconds (ref 24–36)

## 2020-10-08 NOTE — ED Triage Notes (Signed)
Pt c/o headache that he noticed around 0200 this morning. Pt reports this morning about 0700 the pain started "shooting" and he had left side facial numbness and difficulty with his balance. LKW 2200 last night.

## 2020-10-08 NOTE — Discharge Instructions (Signed)
Work-up to include head CT and MRI without any acute findings.  No evidence of stroke or blood on the brain.  Return for any new or worse symptoms.  Make an appointment to follow-up with your primary care doctor.

## 2020-10-08 NOTE — ED Notes (Signed)
Pt ambulated well 

## 2020-10-08 NOTE — ED Provider Notes (Signed)
[facility] EMERGENCY DEPARTMENT Provider Note   CSN: [id number] Arrival date & time: 10/08/20  0754     History Chief Complaint  Patient presents with  . Numbness    Dalton Gregory is a 65 y.o. male.  Patient last normal around 2300 last evening.  Patient awoke around 2 in the morning with a left-sided headache on the top of his head that was kind of a sharp shooting pain.  Waxing and waning.  Not constant.  Associated with some numbness to the left side of the face.  This morning symptoms still there noticed that when he walked that his walking seemed to be not right.  Denies any visual changes or any speech problems.        Past Medical History:  Diagnosis Date  . Acid reflux   . Asthma   . High cholesterol   . Hypertension     Patient Active Problem List   Diagnosis Date Noted  . DYSPEPSIA 07/23/2008  . DIARRHEA 07/23/2008  . ABDOMINAL PAIN 07/23/2008    Past Surgical History:  Procedure Laterality Date  . COLONOSCOPY    . COLONOSCOPY  12/08/2011   Procedure: COLONOSCOPY;  Surgeon: Jamesetta So, MD;  Location: [facility] SUITE;  Service: Gastroenterology;  Laterality: N/A;       Family History  Problem Relation Age of Onset  . Colon cancer Neg Hx     Social History   Tobacco Use  . Smoking status: Former Smoker    Packs/day: 1.00    Years: 20.00    Pack years: 20.00    Types: Cigarettes  . Smokeless tobacco: Never Used  Vaping Use  . Vaping Use: Never used  Substance Use Topics  . Alcohol use: Yes    Comment: 1 pint liquor per week  . Drug use: No    Home Medications Prior to Admission medications   Medication Sig Start Date End Date Taking? Authorizing Provider  albuterol (PROVENTIL HFA;VENTOLIN HFA) 108 (90 BASE) MCG/ACT inhaler Inhale 2 puffs into the lungs every 6 (six) hours as needed. For shortness of breath   Yes [provider]  amLODipine (NORVASC) 5 MG tablet Take 1 tablet by mouth daily. 08/07/20  Yes [provider]  aspirin EC 81 MG tablet Take 81 mg by mouth daily. Swallow whole.   Yes [provider]  naproxen sodium (ANAPROX) 220 MG tablet Take 220 mg by mouth every 8 (eight) hours as needed. For pain   Yes [provider]  rosuvastatin (CRESTOR) 10 MG tablet Take 10 mg by mouth daily. 08/07/20  Yes [provider]  clotrimazole (LOTRIMIN) 1 % cream Apply to affected area 2 times daily Patient not taking: Reported on 10/08/2020 04/01/17   Rancour, Annie Main, MD  doxycycline (VIBRAMYCIN) 100 MG capsule Take 1 capsule (100 mg total) by mouth 2 (two) times daily. Patient not taking: Reported on 10/08/2020 07/12/18   Orpah Greek, MD  predniSONE (DELTASONE) 20 MG tablet Take 1 tablet (20 mg total) by mouth 2 (two) times daily. Patient not taking: Reported on 10/08/2020 07/07/19   Daleen Bo, MD    Allergies    Nexium [esomeprazole magnesium]  Review of Systems   Review of Systems  Constitutional: Negative for chills and fever.  HENT: Negative for ear pain, rhinorrhea and sore throat.   Eyes: Negative for pain and visual disturbance.  Respiratory: Negative for cough and shortness of breath.   Cardiovascular: Negative for chest pain, palpitations and leg  swelling.  Gastrointestinal: Negative for abdominal pain, diarrhea, nausea and vomiting.  Genitourinary: Negative for dysuria and hematuria.  Musculoskeletal: Negative for arthralgias, back pain and neck pain.  Skin: Negative for color change and rash.  Neurological: Positive for numbness and headaches. Negative for dizziness, seizures, syncope and light-headedness.  Hematological: Does not bruise/bleed easily.  Psychiatric/Behavioral: Negative for confusion.  All other systems reviewed and are negative.   Physical Exam Updated Vital Signs BP 131/79   Pulse 64   Temp 98.2 F (36.8 C) (Oral)   Resp 18   Ht 1.753 m (5\' 9" )   Wt 78 kg   SpO2 99%   BMI 25.40 kg/m   Physical Exam Vitals and nursing note  reviewed.  Constitutional:      Appearance: Normal appearance. He is well-developed.  HENT:     Head: Normocephalic and atraumatic.     Mouth/Throat:     Mouth: Mucous membranes are moist.  Eyes:     Extraocular Movements: Extraocular movements intact.     Conjunctiva/sclera: Conjunctivae normal.     Pupils: Pupils are equal, round, and reactive to light.  Cardiovascular:     Rate and Rhythm: Normal rate and regular rhythm.     Heart sounds: No murmur heard.   Pulmonary:     Effort: Pulmonary effort is normal. No respiratory distress.     Breath sounds: Normal breath sounds.  Abdominal:     Palpations: Abdomen is soft.     Tenderness: There is no abdominal tenderness.  Musculoskeletal:        General: No swelling.     Cervical back: Normal range of motion and neck supple. No rigidity.  Skin:    General: Skin is warm and dry.     Capillary Refill: Capillary refill takes 2 to 3 seconds.  Neurological:     Mental Status: He is alert and oriented to person, place, and time.     Cranial Nerves: Cranial nerve deficit present.     Sensory: No sensory deficit.     Motor: No weakness.     Coordination: Coordination normal.     Comments: Patient with numbness to the left side of the face.  No coordination abnormalities.  No weakness of lower extremities upper extremity no facial weakness.     ED Results / Procedures / Treatments   Labs (all labs ordered are listed, but only abnormal results are displayed) Labs Reviewed  COMPREHENSIVE METABOLIC PANEL - Abnormal; Notable for the following components:      Result Value   Glucose, Bld 106 (*)    AST 61 (*)    ALT 80 (*)    All other components within normal limits  RESP PANEL BY RT-PCR (FLU A&B, COVID) ARPGX2  ETHANOL  CBC  DIFFERENTIAL  PROTIME-INR  APTT  RAPID URINE DRUG SCREEN, HOSP PERFORMED  URINALYSIS, ROUTINE W REFLEX MICROSCOPIC  I-STAT CHEM 8, ED  CBG MONITORING, ED    EKG None  Radiology CT HEAD WO  CONTRAST  Result Date: 10/08/2020 CLINICAL DATA:  Neuro deficit, acute stroke suspected. Left-sided headache and dizziness. EXAM: CT HEAD WITHOUT CONTRAST TECHNIQUE: Contiguous axial images were obtained from the base of the skull through the vertex without intravenous contrast. COMPARISON:  None. FINDINGS: Brain: No evidence of acute large vascular territory infarction, hemorrhage, hydrocephalus, extra-axial collection or mass lesion/mass effect. Vascular: No hyperdense vessel identified. Calcific intracranial atherosclerosis. Hypodense area in the region of the torcula is favored to be external/posterior to the dural venous  sinus. No expanded or abnormally hyperdense dural venous sinuses. Skull: No acute fracture. Sinuses/Orbits: Frontal sinus, ethmoid air cell, and left sphenoid sinus mucosal thickening. Other: No mastoid effusions. IMPRESSION: 1. No evidence of acute intracranial abnormality. 2. Moderate paranasal sinus mucosal thickening. Correlate with the presence or absence of signs/symptoms of sinusitis. Electronically Signed   By: Margaretha Sheffield MD   On: 10/08/2020 08:38   MR Brain Wo Contrast (neuro protocol)  Result Date: 10/08/2020 CLINICAL DATA:  Neuro deficit, acute, stroke suspected. Additional history provided: Patient reports headache, left-sided facial numbness and difficulty with balance. EXAM: MRI HEAD WITHOUT CONTRAST TECHNIQUE: Multiplanar, multiecho pulse sequences of the brain and surrounding structures were obtained without intravenous contrast. COMPARISON:  CT head 10/08/2020. FINDINGS: Brain: Cerebral volume is normal for age. No cortical encephalomalacia is identified. No significant white matter disease. There is no acute infarct. No evidence of intracranial mass. No chronic intracranial blood products. No extra-axial fluid collection. No midline shift. Vascular: Expected proximal arterial flow voids. Skull and upper cervical spine: No focal marrow lesion. Sinuses/Orbits:  Visualized orbits show no acute finding. Moderate/severe mucosal thickening within the left frontal sinus. Moderate left greater than right ethmoid sinus mucosal thickening. Moderate mucosal thickening within the left sphenoid sinus. Trace right sphenoid and bilateral maxillary sinus mucosal thickening. IMPRESSION: Unremarkable non-contrast MRI appearance of the brain for age. No evidence of acute intracranial abnormality. Paranasal sinus disease, as described. Electronically Signed   By: Kellie Simmering DO   On: 10/08/2020 09:55    Procedures Procedures   CRITICAL CARE Performed by: Fredia Sorrow Total critical care time: 35 minutes Critical care time was exclusive of separately billable procedures and treating other patients. Critical care was necessary to treat or prevent imminent or life-threatening deterioration. Critical care was time spent personally by me on the following activities: development of treatment plan with patient and/or surrogate as well as nursing, discussions with consultants, evaluation of patient's response to treatment, examination of patient, obtaining history from patient or surrogate, ordering and performing treatments and interventions, ordering and review of laboratory studies, ordering and review of radiographic studies, pulse oximetry and re-evaluation of patient's condition.    Medications Ordered in ED Medications - No data to display  ED Course  I have reviewed the triage vital signs and the nursing notes.  Pertinent labs & imaging results that were available during my care of the patient were reviewed by me and considered in my medical decision making (see chart for details).    MDM Rules/Calculators/A&P                          Patient last normal 2300.  Left-sided facial numbness left-sided headache.  No obvious coordination abnormalities in the bed.  The patient was not ambulated.  Will activate stroke order set but not activate code stroke as patient  is out of the tPA window.  Patient has no pacemaker or metal in his body that is nonsurgical.  So could be a candidate for MRI.  Head CT MRI and labs without any significant abnormalities.  No evidence of any neoplastic process or evidence of stroke.  Patient ambulated in the room without any difficulty states he feels fine.  Patient stable for discharge home follow-up with primary care doctor.  Final Clinical Impression(s) / ED Diagnoses Final diagnoses:  Nonintractable headache, unspecified chronicity pattern, unspecified headache type  Numbness    Rx / DC Orders ED Discharge Orders  None       Fredia Sorrow, MD 10/08/20 1014

## 2020-10-17 ENCOUNTER — Other Ambulatory Visit (HOSPITAL_COMMUNITY): Payer: Self-pay | Admitting: Internal Medicine

## 2020-10-17 ENCOUNTER — Other Ambulatory Visit: Payer: Self-pay | Admitting: Internal Medicine

## 2020-10-17 DIAGNOSIS — R945 Abnormal results of liver function studies: Secondary | ICD-10-CM

## 2020-10-25 ENCOUNTER — Ambulatory Visit (HOSPITAL_COMMUNITY): Payer: Medicare HMO

## 2020-10-25 ENCOUNTER — Encounter (HOSPITAL_COMMUNITY): Payer: Self-pay

## 2020-10-28 ENCOUNTER — Ambulatory Visit (HOSPITAL_COMMUNITY)
Admission: RE | Admit: 2020-10-28 | Discharge: 2020-10-28 | Disposition: A | Discharge status: Home / Self Care | Payer: Medicare HMO | Source: Ambulatory Visit | Attending: Internal Medicine | Admitting: Internal Medicine

## 2020-10-28 ENCOUNTER — Other Ambulatory Visit: Payer: Self-pay

## 2020-10-28 DIAGNOSIS — R945 Abnormal results of liver function studies: Secondary | ICD-10-CM | POA: Insufficient documentation

## 2020-10-28 DIAGNOSIS — R7989 Other specified abnormal findings of blood chemistry: Secondary | ICD-10-CM | POA: Diagnosis not present

## 2020-10-28 DIAGNOSIS — K7689 Other specified diseases of liver: Secondary | ICD-10-CM | POA: Diagnosis not present

## 2020-11-06 ENCOUNTER — Encounter (INDEPENDENT_AMBULATORY_CARE_PROVIDER_SITE_OTHER): Payer: Self-pay | Admitting: *Deleted

## 2020-11-08 ENCOUNTER — Ambulatory Visit (INDEPENDENT_AMBULATORY_CARE_PROVIDER_SITE_OTHER): Payer: Medicare HMO | Admitting: Internal Medicine

## 2020-11-08 ENCOUNTER — Encounter (INDEPENDENT_AMBULATORY_CARE_PROVIDER_SITE_OTHER): Payer: Self-pay | Admitting: Internal Medicine

## 2020-11-08 ENCOUNTER — Other Ambulatory Visit: Payer: Self-pay

## 2020-11-08 ENCOUNTER — Other Ambulatory Visit (INDEPENDENT_AMBULATORY_CARE_PROVIDER_SITE_OTHER): Payer: Self-pay | Admitting: Internal Medicine

## 2020-11-08 DIAGNOSIS — R945 Abnormal results of liver function studies: Secondary | ICD-10-CM

## 2020-11-08 DIAGNOSIS — D649 Anemia, unspecified: Secondary | ICD-10-CM | POA: Diagnosis not present

## 2020-11-08 DIAGNOSIS — R7989 Other specified abnormal findings of blood chemistry: Secondary | ICD-10-CM

## 2020-11-08 NOTE — Patient Instructions (Signed)
Discontinue Crestor/rosuvastatin. Decrease alcohol intake to no more than 2 drinks per day and ideally you should be drinking none with elevated liver enzymes. Physician will call with results of blood work and ultrasound when completed.

## 2020-11-08 NOTE — Progress Notes (Signed)
Reason for consultation  Elevated transaminases Abnormal liver ultrasound  History of present illness  Dalton Gregory is 65 year old African-American male who was referred to courtesy of Dr. Kerin Perna for GI evaluation. Dalton Gregory has routine blood work on Oct 04, 2020 revealing AST of 58 and ALT of 87.  Repeat LFTs 4 days later revealed AST of 61 and ALT of 80. He underwent abdominal ultrasound examination on 10/28/2020.  There were no gallstones or gallbladder wall thickening.  His bile duct measured 7 mm.  Liver was echogenic with nodular hepatic contour concerning for cirrhosis.  There were no focal lesions.  Portal vein was patent with normal directional blood flow.  Spleen size was normal. Dalton Gregory has no complaints.  He feels well.  He denies pruritus or abdominal pain.  His appetite is good and his weight has been stable.  He has heartburn no more than once a week with certain foods.  Bowels move daily or every other day.  He denies melena or rectal bleeding.  He had normal colonoscopy 9 years ago.  It was a screening exam.  He denies skin rash or polyarthralgia.  He does have right shoulder pain which she feels is work-related.  He also denies myalgias. He has received hepatitis A and B vaccination in the past. He drinks 1/5 of vodka every day.  He he states he has been drinking like this for 10 to 11 years.  No history of IV drug use.  Current Medications: Outpatient Encounter Medications as of 11/08/2020  Medication Sig   albuterol (PROVENTIL HFA;VENTOLIN HFA) 108 (90 BASE) MCG/ACT inhaler Inhale 2 puffs into the lungs every 6 (six) hours as needed. For shortness of breath   amLODipine (NORVASC) 5 MG tablet Take 1 tablet by mouth daily.   aspirin EC 81 MG tablet Take 81 mg by mouth daily. Swallow whole.   Multiple Vitamins-Minerals (MULTIVITAMIN WITH MINERALS) tablet Take 1 tablet by mouth daily.   rosuvastatin (CRESTOR) 10 MG tablet Take 10 mg by mouth daily.   [DISCONTINUED] clotrimazole  (LOTRIMIN) 1 % cream Apply to affected area 2 times daily (Dalton Gregory not taking: Reported on 10/08/2020)   [DISCONTINUED] doxycycline (VIBRAMYCIN) 100 MG capsule Take 1 capsule (100 mg total) by mouth 2 (two) times daily. (Dalton Gregory not taking: Reported on 10/08/2020)   [DISCONTINUED] naproxen sodium (ANAPROX) 220 MG tablet Take 220 mg by mouth every 8 (eight) hours as needed. For pain   [DISCONTINUED] predniSONE (DELTASONE) 20 MG tablet Take 1 tablet (20 mg total) by mouth 2 (two) times daily. (Dalton Gregory not taking: Reported on 10/08/2020)   No facility-administered encounter medications on file as of 11/08/2020.   Past medical history  Hypertension. Hyperlipidemia. Normal screening colonoscopy in July 2013.  Allergies Allergies  Allergen Reactions   Nexium [Esomeprazole Magnesium] Other (See Comments)    Pt reports allergy to generic nexium. Makes pt dizzy     Family history  Father was diabetic and lived to be 45.  Mother was diagnosed with breast carcinoma at age 36 and died 58 years later. He 4 brothers and 2 sisters living.  They are in good health.  1 sister died of staphylococcal sepsis at age 57.  67 brother died on his first birthday.  He had congenital heart disease.  Another brother was murdered/shortly when he was 65 years old.  Social history  Dalton Gregory is married.  He has 5 sons and 1 daughter and they are all grown up and in good health.  He works as a Associate Professor  for [employer].  He is in with his company for 47 years.  He smokes cigarettes a pack or less than a pack a day for 25 years but quit 25 years ago.  He drinks 1/5 of vodka every day as above.  Dalton Gregory says he tries to walk 2 miles every day.  Objective: Blood pressure 137/80, pulse 72, temperature 98.7 F (37.1 C), temperature source Oral, height 5' 9" (1.753 m), weight 172 lb (78 kg). Dalton Gregory is alert and in no acute distress. Conjunctiva is pink. Sclera is nonicteric Oropharyngeal mucosa is normal. No  neck masses or thyromegaly noted. Cardiac exam with regular rhythm normal S1 and S2. No murmur or gallop noted. Lungs are clear to auscultation. Abdomen;  No LE edema or clubbing noted.  Labs/studies Results:   CBC Latest Ref Rng & Units 10/08/2020 10/08/2020 07/07/2019  WBC 4.0 - 10.5 K/uL - 9.6 6.3  Hemoglobin 13.0 - 17.0 g/dL 15.0 14.2 13.1  Hematocrit 39.0 - 52.0 % 44.0 41.7 39.1  Platelets 150 - 400 K/uL - 312 281    CMP Latest Ref Rng & Units 10/08/2020 10/08/2020 07/07/2019  Glucose 70 - 99 mg/dL 99 106(H) 96  BUN 8 - 23 mg/dL _0 Creatinine 0.61 - 1.24 mg/dL 0.80 0.84 0.71  Sodium 135 - 145 mmol/L 142 140 141  Potassium 3.5 - 5.1 mmol/L 4.7 3.6 3.6  Chloride 98 - 111 mmol/L 107 106 103  CO2 22 - 32 mmol/L - 25 29  Calcium 8.9 - 10.3 mg/dL - 9.5 9.3  Total Protein 6.5 - 8.1 g/dL - 8.0 -  Total Bilirubin 0.3 - 1.2 mg/dL - 0.8 -  Alkaline Phos 38 - 126 U/L - 62 -  AST 15 - 41 U/L - 61(H) -  ALT 0 - 44 U/L - 80(H) -    Hepatic Function Latest Ref Rng & Units 10/08/2020  Total Protein 6.5 - 8.1 g/dL 8.0  Albumin 3.5 - 5.0 g/dL 4.6  AST 15 - 41 U/L 61(H)  ALT 0 - 44 U/L 80(H)  Alk Phosphatase 38 - 126 U/L 62  Total Bilirubin 0.3 - 1.2 mg/dL 0.8    Lab data from 10/04/2020  AST 58 and ALT 87.  Abdominal ultrasound from 10/28/2020.  Results as above.  Following old studies were also reviewed. Ultrasound from 01/04/2008 revealed liver and liver contour. Abdominal pelvic CT on 12/24/2008 reveals normal appearance to liver.  Assessment:  #1.  Abnormal LFTs.  He was noted to have mildly elevated transaminases about 2 months ago.  AST greater than ALT.  About 2 fold elevation.  Abdominal ultrasound revealed CBD diameter of 7 mm which is upper limit of normal and liver texture was echogenic and contour changes concerning for cirrhosis.  Dalton Gregory has received hepatitis A and B vaccination in the past. His risk factors for liver disease include statin use and alcohol use.  It  appears he has been on statin for 4 to 5 months which may well be the culprit.  He remains to be seen if alcohol is a reason for his chronic injury. Concern has been raised about extrahepatic process.  His bile duct is only 7 mm which is upper limit of normal.  It would be worthwhile repeating an ultrasound and document the bile duct diameter has not increased.  #2.  Dalton Gregory is average risk for colorectal cancer.  Last exam was in July 2013 and next 1 due in July 2023.  Recommendations  Discontinue Crestor/rosuvastatin until  further notice. Dalton Gregory advised to decrease alcohol intake to normal 2 drinks per day.  He can do it over.  Few days ago would not get redrawn.  Eventually he should bring intake down to 0 but if he cannot he should drink no more than 2 drinks per day.  These recommendations may change when work-up is completed. Abdominal ultrasound with elastography. Dalton Gregory we will allow for the following. LFTs. Hepatitis a total antibody. Hepatitis B surface antigen and hepatitis B surface antibody. HCV antibody. Serum iron TIBC and ferritin. Dalton Gregory will be contacted with results of the studies and we will plan to see him back in 3 months.

## 2020-11-12 LAB — HEPATITIS C ANTIBODY
Hepatitis C Ab: NONREACTIVE
SIGNAL TO CUT-OFF: 0.02 (ref <1.00)

## 2020-11-12 LAB — HEPATIC FUNCTION PANEL
AG Ratio: 2 (calc) (ref 1.0–2.5)
ALT: 40 U/L (ref 9–46)
AST: 29 U/L (ref 10–35)
Albumin: 4.7 g/dL (ref 3.6–5.1)
Alkaline phosphatase (APISO): 51 U/L (ref 35–144)
Bilirubin, Direct: 0.1 mg/dL (ref 0.0–0.2)
Globulin: 2.4 g/dL (calc) (ref 1.9–3.7)
Indirect Bilirubin: 0.3 mg/dL (calc) (ref 0.2–1.2)
Total Bilirubin: 0.4 mg/dL (ref 0.2–1.2)
Total Protein: 7.1 g/dL (ref 6.1–8.1)

## 2020-11-12 LAB — HEPATITIS B SURFACE ANTIGEN: Hepatitis B Surface Ag: NONREACTIVE

## 2020-11-12 LAB — HEPATITIS A ANTIBODY, TOTAL: Hepatitis A AB,Total: REACTIVE — AB

## 2020-11-12 LAB — IRON,TIBC AND FERRITIN PANEL
%SAT: 41 % (calc) (ref 20–48)
Ferritin: 407 ng/mL — ABNORMAL HIGH (ref 24–380)
Iron: 114 ug/dL (ref 50–180)
TIBC: 281 mcg/dL (calc) (ref 250–425)

## 2020-11-12 LAB — HEPATITIS B SURFACE ANTIBODY,QUALITATIVE: Hep B S Ab: NONREACTIVE

## 2020-11-21 ENCOUNTER — Encounter (INDEPENDENT_AMBULATORY_CARE_PROVIDER_SITE_OTHER): Payer: Self-pay

## 2020-11-29 ENCOUNTER — Ambulatory Visit (HOSPITAL_COMMUNITY)
Admission: RE | Admit: 2020-11-29 | Discharge: 2020-11-29 | Disposition: A | Discharge status: Home / Self Care | Payer: Medicare HMO | Source: Ambulatory Visit | Attending: Internal Medicine | Admitting: Internal Medicine

## 2020-11-29 ENCOUNTER — Other Ambulatory Visit: Payer: Self-pay

## 2020-11-29 DIAGNOSIS — R945 Abnormal results of liver function studies: Secondary | ICD-10-CM | POA: Diagnosis not present

## 2020-11-29 DIAGNOSIS — R932 Abnormal findings on diagnostic imaging of liver and biliary tract: Secondary | ICD-10-CM | POA: Insufficient documentation

## 2020-11-29 DIAGNOSIS — R7989 Other specified abnormal findings of blood chemistry: Secondary | ICD-10-CM

## 2020-11-29 DIAGNOSIS — K7460 Unspecified cirrhosis of liver: Secondary | ICD-10-CM | POA: Diagnosis not present

## 2021-02-05 ENCOUNTER — Other Ambulatory Visit (INDEPENDENT_AMBULATORY_CARE_PROVIDER_SITE_OTHER): Payer: Self-pay

## 2021-02-05 ENCOUNTER — Encounter (INDEPENDENT_AMBULATORY_CARE_PROVIDER_SITE_OTHER): Payer: Self-pay

## 2021-02-05 DIAGNOSIS — R945 Abnormal results of liver function studies: Secondary | ICD-10-CM

## 2021-02-05 DIAGNOSIS — R7989 Other specified abnormal findings of blood chemistry: Secondary | ICD-10-CM

## 2021-03-03 ENCOUNTER — Ambulatory Visit (INDEPENDENT_AMBULATORY_CARE_PROVIDER_SITE_OTHER): Payer: Medicare HMO | Admitting: Gastroenterology

## 2021-03-20 ENCOUNTER — Ambulatory Visit (INDEPENDENT_AMBULATORY_CARE_PROVIDER_SITE_OTHER): Payer: Medicare HMO | Admitting: Gastroenterology

## 2021-04-14 ENCOUNTER — Ambulatory Visit (INDEPENDENT_AMBULATORY_CARE_PROVIDER_SITE_OTHER): Payer: Medicare HMO | Admitting: Gastroenterology

## 2021-05-22 DIAGNOSIS — H5200 Hypermetropia, unspecified eye: Secondary | ICD-10-CM | POA: Diagnosis not present

## 2021-05-24 DIAGNOSIS — H52223 Regular astigmatism, bilateral: Secondary | ICD-10-CM | POA: Diagnosis not present

## 2021-05-24 DIAGNOSIS — H524 Presbyopia: Secondary | ICD-10-CM | POA: Diagnosis not present

## 2021-05-24 DIAGNOSIS — Z961 Presence of intraocular lens: Secondary | ICD-10-CM | POA: Diagnosis not present

## 2021-06-06 DIAGNOSIS — J449 Chronic obstructive pulmonary disease, unspecified: Secondary | ICD-10-CM | POA: Diagnosis not present

## 2021-06-06 DIAGNOSIS — Z6828 Body mass index (BMI) 28.0-28.9, adult: Secondary | ICD-10-CM | POA: Diagnosis not present

## 2021-06-06 DIAGNOSIS — E663 Overweight: Secondary | ICD-10-CM | POA: Diagnosis not present

## 2021-06-06 DIAGNOSIS — I10 Essential (primary) hypertension: Secondary | ICD-10-CM | POA: Diagnosis not present

## 2021-11-14 DIAGNOSIS — I10 Essential (primary) hypertension: Secondary | ICD-10-CM | POA: Diagnosis not present

## 2021-11-14 DIAGNOSIS — Z0001 Encounter for general adult medical examination with abnormal findings: Secondary | ICD-10-CM | POA: Diagnosis not present

## 2021-11-14 DIAGNOSIS — E785 Hyperlipidemia, unspecified: Secondary | ICD-10-CM | POA: Diagnosis not present

## 2021-11-14 DIAGNOSIS — E663 Overweight: Secondary | ICD-10-CM | POA: Diagnosis not present

## 2021-11-14 DIAGNOSIS — Z125 Encounter for screening for malignant neoplasm of prostate: Secondary | ICD-10-CM | POA: Diagnosis not present

## 2021-11-14 DIAGNOSIS — Z6828 Body mass index (BMI) 28.0-28.9, adult: Secondary | ICD-10-CM | POA: Diagnosis not present

## 2021-11-14 DIAGNOSIS — Z1331 Encounter for screening for depression: Secondary | ICD-10-CM | POA: Diagnosis not present

## 2021-11-14 DIAGNOSIS — J449 Chronic obstructive pulmonary disease, unspecified: Secondary | ICD-10-CM | POA: Diagnosis not present

## 2022-01-02 DIAGNOSIS — D125 Benign neoplasm of sigmoid colon: Secondary | ICD-10-CM | POA: Diagnosis not present

## 2022-01-02 DIAGNOSIS — Z1211 Encounter for screening for malignant neoplasm of colon: Secondary | ICD-10-CM | POA: Diagnosis not present

## 2022-01-02 DIAGNOSIS — K648 Other hemorrhoids: Secondary | ICD-10-CM | POA: Diagnosis not present

## 2022-01-02 DIAGNOSIS — D120 Benign neoplasm of cecum: Secondary | ICD-10-CM | POA: Diagnosis not present

## 2022-01-02 DIAGNOSIS — D123 Benign neoplasm of transverse colon: Secondary | ICD-10-CM | POA: Diagnosis not present

## 2022-01-02 DIAGNOSIS — D124 Benign neoplasm of descending colon: Secondary | ICD-10-CM | POA: Diagnosis not present

## 2022-01-02 DIAGNOSIS — K5730 Diverticulosis of large intestine without perforation or abscess without bleeding: Secondary | ICD-10-CM | POA: Diagnosis not present

## 2022-01-08 DIAGNOSIS — D125 Benign neoplasm of sigmoid colon: Secondary | ICD-10-CM | POA: Diagnosis not present

## 2022-01-08 DIAGNOSIS — D123 Benign neoplasm of transverse colon: Secondary | ICD-10-CM | POA: Diagnosis not present

## 2022-01-08 DIAGNOSIS — D120 Benign neoplasm of cecum: Secondary | ICD-10-CM | POA: Diagnosis not present

## 2022-01-08 DIAGNOSIS — D124 Benign neoplasm of descending colon: Secondary | ICD-10-CM | POA: Diagnosis not present

## 2022-01-16 IMAGING — CT CT HEAD W/O CM
3 series · 15 of 47 positions shown, 18 images · non-contrast
Comparison: None.

CLINICAL DATA: Neuro deficit, acute stroke suspected. Left-sided
headache and dizziness.

EXAM:
CT HEAD WITHOUT CONTRAST
TECHNIQUE: Contiguous axial images were obtained from the base of the skull
through the vertex without intravenous contrast.

[Series 2: head w o · axial · 0.40mm/px · z∈[+1650,+1775]mm · 9 of 31 slices shown, 12 images]
[im 3/31  brain]
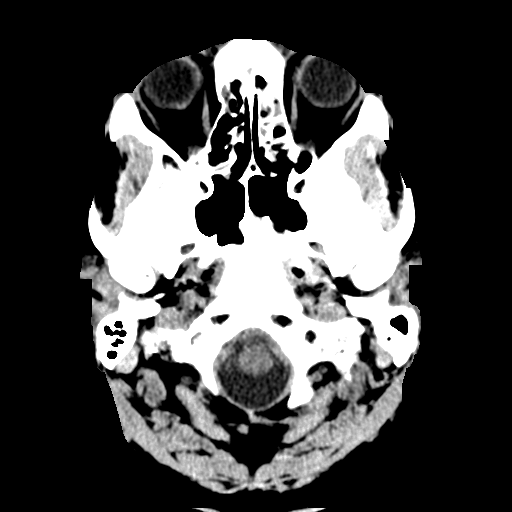
[im 3/31  bone]
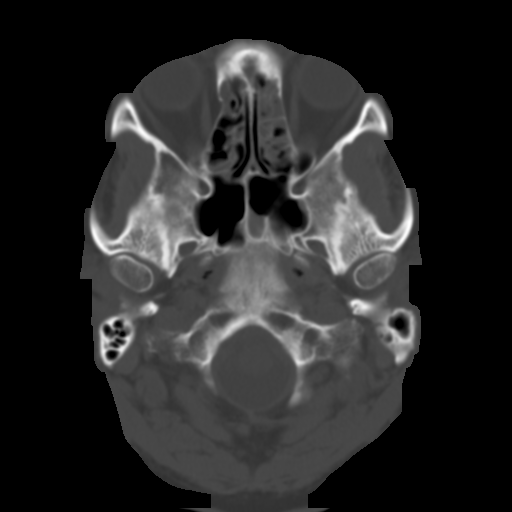
[im 6/31  brain]
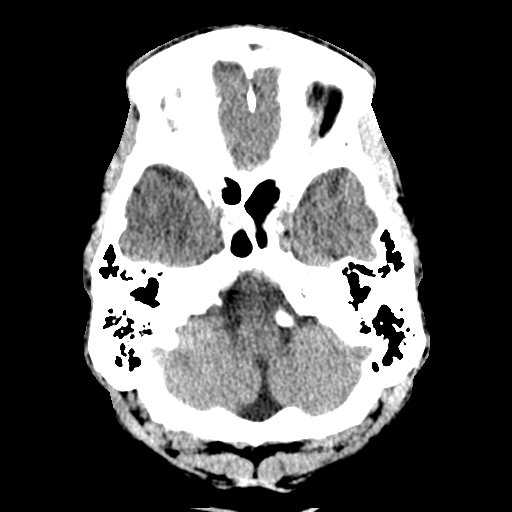
[im 9/31  brain]
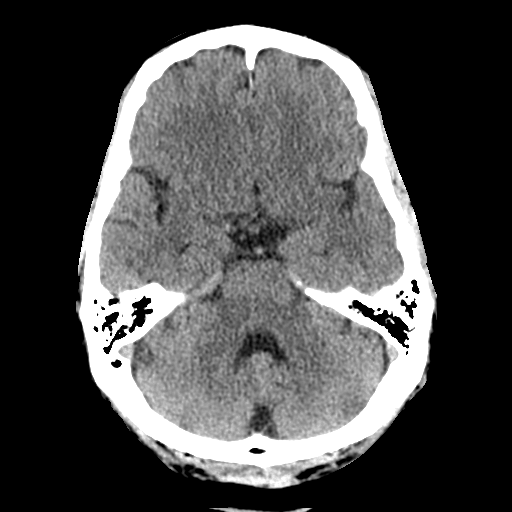
[im 12/31  brain]
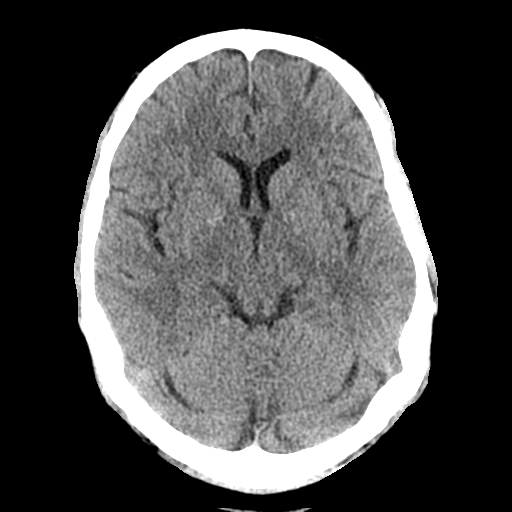
[im 16/31  brain]
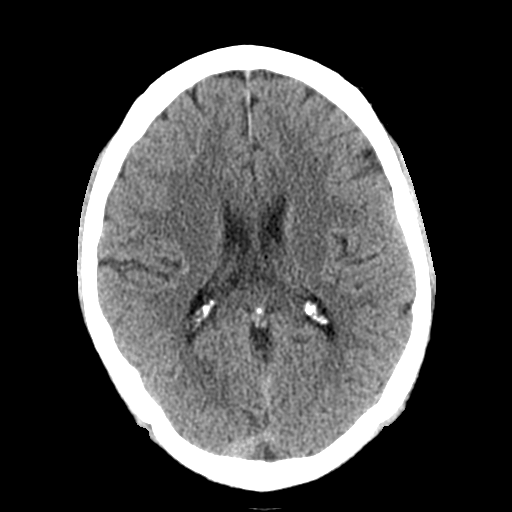
[im 16/31  bone]
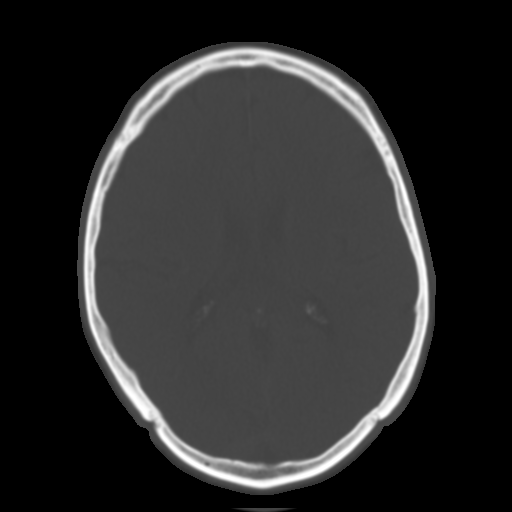
[im 19/31  brain]
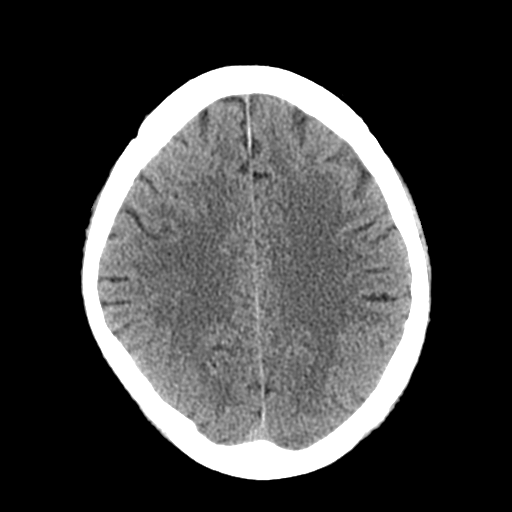
[im 22/31  brain]
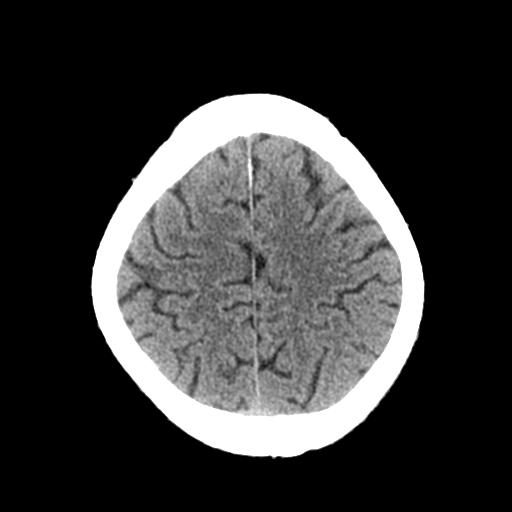
[im 25/31  brain]
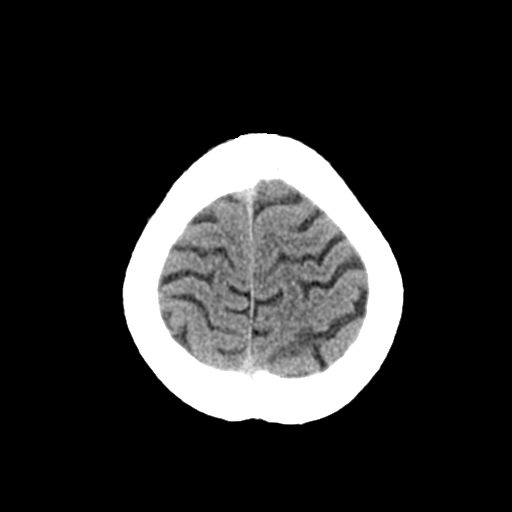
[im 28/31  brain]
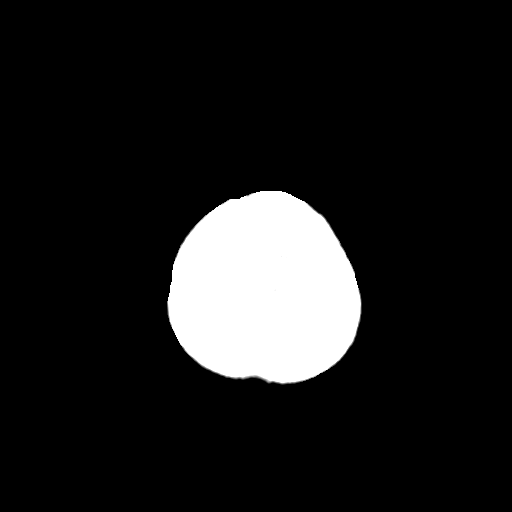
[im 28/31  bone]
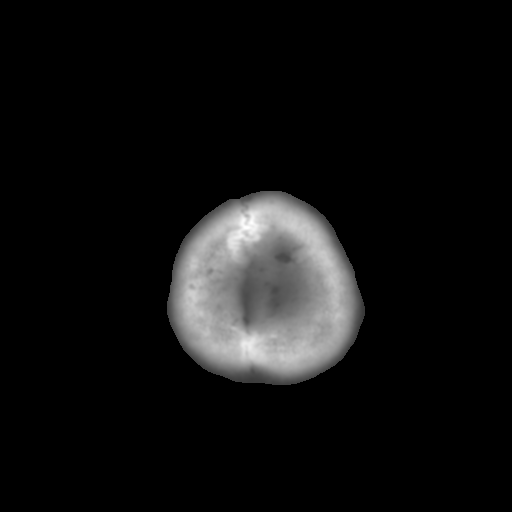

[Series 4: coronal soft · coronal · 0.32mm/px · 3 of 79 slices shown]
[im 27/79  brain]
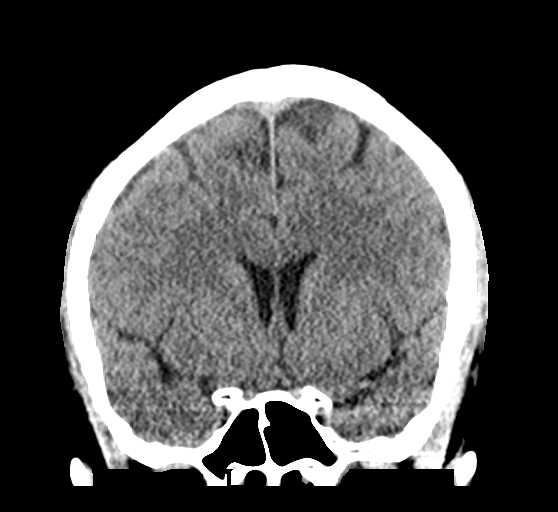
[im 35/79  brain]
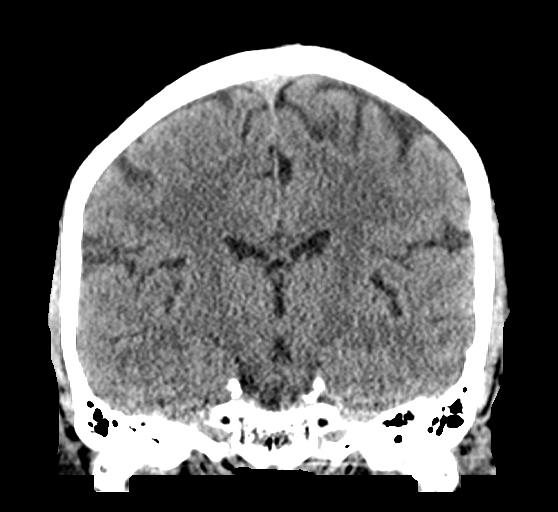
[im 44/79  brain]
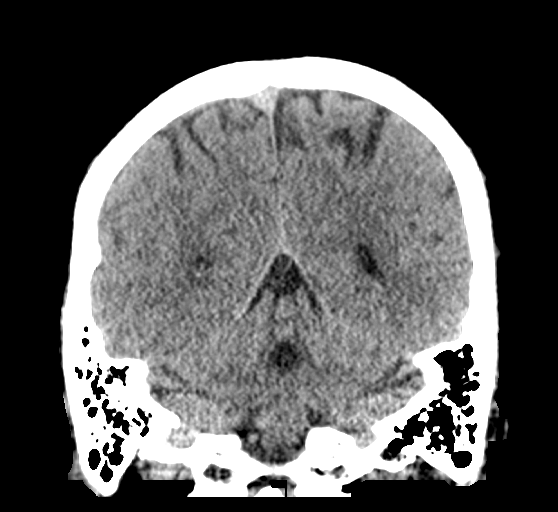

[Series 5: sagittal soft · sagittal · 0.35mm/px · 3 of 59 slices shown]
[im 20/59  brain]
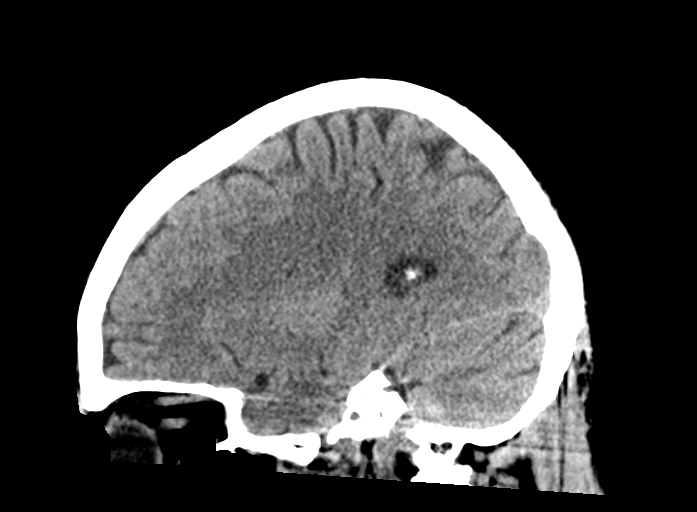
[im 30/59  brain]
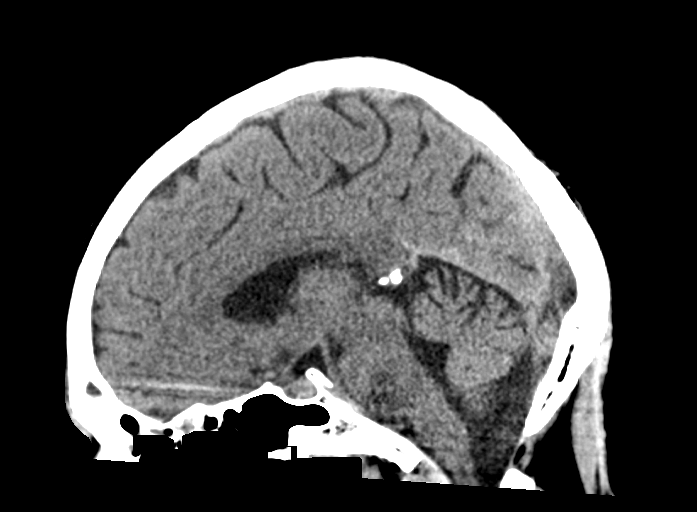
[im 39/59  brain]
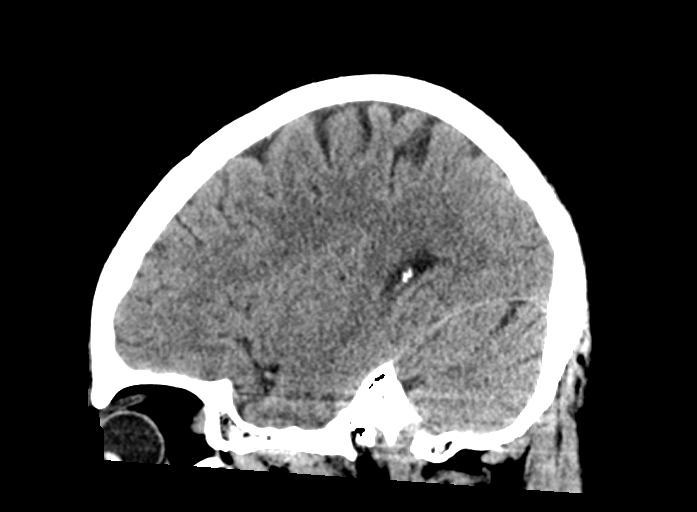

[15 of 47 positions shown; findings below may reference images not displayed]

FINDINGS: Brain: No evidence of acute large vascular territory infarction,
hemorrhage, hydrocephalus, extra-axial collection or mass
lesion/mass effect.

Vascular: No hyperdense vessel identified. Calcific intracranial
atherosclerosis. Hypodense area in the region of the torcula is
favored to be external/posterior to the dural venous sinus. No
expanded or abnormally hyperdense dural venous sinuses.

Skull: No acute fracture.

Sinuses/Orbits: Frontal sinus, ethmoid air cell, and left sphenoid
sinus mucosal thickening.

Other: No mastoid effusions.
IMPRESSION: 1. No evidence of acute intracranial abnormality.
2. Moderate paranasal sinus mucosal thickening. Correlate with the
presence or absence of signs/symptoms of sinusitis.

## 2022-11-25 DIAGNOSIS — E785 Hyperlipidemia, unspecified: Secondary | ICD-10-CM | POA: Diagnosis not present

## 2022-11-25 DIAGNOSIS — I10 Essential (primary) hypertension: Secondary | ICD-10-CM | POA: Diagnosis not present

## 2022-11-25 DIAGNOSIS — Z1331 Encounter for screening for depression: Secondary | ICD-10-CM | POA: Diagnosis not present

## 2022-11-25 DIAGNOSIS — Z6823 Body mass index (BMI) 23.0-23.9, adult: Secondary | ICD-10-CM | POA: Diagnosis not present

## 2022-11-25 DIAGNOSIS — E663 Overweight: Secondary | ICD-10-CM | POA: Diagnosis not present

## 2022-11-25 DIAGNOSIS — Z0001 Encounter for general adult medical examination with abnormal findings: Secondary | ICD-10-CM | POA: Diagnosis not present

## 2022-12-29 DIAGNOSIS — H5203 Hypermetropia, bilateral: Secondary | ICD-10-CM | POA: Diagnosis not present

## 2023-04-20 DIAGNOSIS — Z6827 Body mass index (BMI) 27.0-27.9, adult: Secondary | ICD-10-CM | POA: Diagnosis not present

## 2023-04-20 DIAGNOSIS — E663 Overweight: Secondary | ICD-10-CM | POA: Diagnosis not present

## 2023-04-20 DIAGNOSIS — I10 Essential (primary) hypertension: Secondary | ICD-10-CM | POA: Diagnosis not present

## 2023-08-19 DIAGNOSIS — E663 Overweight: Secondary | ICD-10-CM | POA: Diagnosis not present

## 2023-08-19 DIAGNOSIS — K0889 Other specified disorders of teeth and supporting structures: Secondary | ICD-10-CM | POA: Diagnosis not present

## 2023-08-19 DIAGNOSIS — Z6827 Body mass index (BMI) 27.0-27.9, adult: Secondary | ICD-10-CM | POA: Diagnosis not present

## 2023-08-19 DIAGNOSIS — I10 Essential (primary) hypertension: Secondary | ICD-10-CM | POA: Diagnosis not present

## 2023-12-28 ENCOUNTER — Other Ambulatory Visit (HOSPITAL_COMMUNITY): Payer: Self-pay | Admitting: Family Medicine

## 2023-12-28 ENCOUNTER — Ambulatory Visit (HOSPITAL_COMMUNITY)
Admission: RE | Admit: 2023-12-28 | Discharge: 2023-12-28 | Disposition: A | Discharge status: Home / Self Care | Source: Ambulatory Visit | Attending: Family Medicine | Admitting: Family Medicine

## 2023-12-28 DIAGNOSIS — I10 Essential (primary) hypertension: Secondary | ICD-10-CM | POA: Diagnosis not present

## 2023-12-28 DIAGNOSIS — Z6827 Body mass index (BMI) 27.0-27.9, adult: Secondary | ICD-10-CM | POA: Diagnosis not present

## 2023-12-28 DIAGNOSIS — E785 Hyperlipidemia, unspecified: Secondary | ICD-10-CM | POA: Diagnosis not present

## 2023-12-28 DIAGNOSIS — G4485 Primary stabbing headache: Secondary | ICD-10-CM | POA: Diagnosis not present

## 2023-12-28 DIAGNOSIS — Z0001 Encounter for general adult medical examination with abnormal findings: Secondary | ICD-10-CM | POA: Diagnosis not present

## 2023-12-28 DIAGNOSIS — Z1331 Encounter for screening for depression: Secondary | ICD-10-CM | POA: Diagnosis not present

## 2023-12-28 DIAGNOSIS — E663 Overweight: Secondary | ICD-10-CM | POA: Diagnosis not present

## 2024-04-11 DIAGNOSIS — H5203 Hypermetropia, bilateral: Secondary | ICD-10-CM | POA: Diagnosis not present

## 2024-04-15 DIAGNOSIS — H52223 Regular astigmatism, bilateral: Secondary | ICD-10-CM | POA: Diagnosis not present

## 2024-04-15 DIAGNOSIS — H524 Presbyopia: Secondary | ICD-10-CM | POA: Diagnosis not present
